# Patient Record
Sex: Male | Born: 1985 | Race: White | Hispanic: No | Marital: Married | Smoking: Never smoker
Health system: Southern US, Community
[De-identification: ages and names within clinical notes are randomized; demographics above are authoritative.]

## PROBLEM LIST (undated history)

## (undated) DIAGNOSIS — Z789 Other specified health status: Secondary | ICD-10-CM

## (undated) HISTORY — DX: Other specified health status: Z78.9

## (undated) HISTORY — PX: NO PAST SURGERIES: SHX2092

---

## 2019-09-11 DIAGNOSIS — Z20822 Contact with and (suspected) exposure to covid-19: Secondary | ICD-10-CM | POA: Diagnosis not present

## 2019-09-15 DIAGNOSIS — Z20828 Contact with and (suspected) exposure to other viral communicable diseases: Secondary | ICD-10-CM | POA: Diagnosis not present

## 2019-10-28 DIAGNOSIS — Z23 Encounter for immunization: Secondary | ICD-10-CM | POA: Diagnosis not present

## 2020-01-01 ENCOUNTER — Other Ambulatory Visit: Payer: Self-pay

## 2020-01-01 ENCOUNTER — Emergency Department (HOSPITAL_COMMUNITY)
Admission: EM | Admit: 2020-01-01 | Discharge: 2020-01-01 | Disposition: A | Discharge status: Home / Self Care | Payer: PRIVATE HEALTH INSURANCE | Attending: Emergency Medicine | Admitting: Emergency Medicine

## 2020-01-01 ENCOUNTER — Encounter (HOSPITAL_COMMUNITY): Payer: Self-pay

## 2020-01-01 DIAGNOSIS — S0993XA Unspecified injury of face, initial encounter: Secondary | ICD-10-CM | POA: Diagnosis present

## 2020-01-01 DIAGNOSIS — S0083XA Contusion of other part of head, initial encounter: Secondary | ICD-10-CM | POA: Diagnosis not present

## 2020-01-01 DIAGNOSIS — Y92230 Patient room in hospital as the place of occurrence of the external cause: Secondary | ICD-10-CM | POA: Insufficient documentation

## 2020-01-01 MED ORDER — IBUPROFEN 400 MG PO TABS
600.0000 mg | ORAL_TABLET | Freq: Once | ORAL | Status: AC
  Administered 2020-01-01: 600 mg via ORAL
  Filled 2020-01-01: qty 1

## 2020-01-01 NOTE — Discharge Instructions (Addendum)
Follow-up closely with primary doctor for reassessment and blood pressure management. Follow-up with employee health as needed. Return for new concerns. Use Tylenol every 4 hours and Motrin every 6 hours and ice as needed.
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
## 2020-01-08 ENCOUNTER — Other Ambulatory Visit: Payer: Self-pay

## 2020-01-08 ENCOUNTER — Encounter: Payer: Self-pay | Admitting: Medical-Surgical

## 2020-01-08 ENCOUNTER — Ambulatory Visit (INDEPENDENT_AMBULATORY_CARE_PROVIDER_SITE_OTHER): Payer: 59 | Admitting: Medical-Surgical

## 2020-01-08 VITALS — BP 146/79 | HR 97 | Temp 99.0°F | Ht 68.5 in | Wt 277.6 lb

## 2020-01-08 DIAGNOSIS — Z7689 Persons encountering health services in other specified circumstances: Secondary | ICD-10-CM

## 2020-01-08 DIAGNOSIS — I998 Other disorder of circulatory system: Secondary | ICD-10-CM | POA: Diagnosis not present

## 2020-01-08 DIAGNOSIS — R0602 Shortness of breath: Secondary | ICD-10-CM | POA: Diagnosis not present

## 2020-01-08 DIAGNOSIS — I479 Paroxysmal tachycardia, unspecified: Secondary | ICD-10-CM

## 2020-01-08 DIAGNOSIS — R079 Chest pain, unspecified: Secondary | ICD-10-CM | POA: Diagnosis not present

## 2020-01-08 NOTE — Progress Notes (Signed)
New Patient Office Visit  Subjective:  Patient ID: Bryan Jackson, male    DOB: Aug 12, 1985  Age: 34 y.o. MRN: [id number]  CC:  Chief Complaint  Patient presents with  . Establish Care  . Chest Pain    HPI Bryan Jackson presents to establish care.   Presents today with reports of intermittent chest pain described as intense, sharp, and pressure-like. Has been as high as 10/10 but he did not seek ED or UC care. His chest pain is often accompanied by nausea, dizziness, shortness of breath, and diaphoresis. He also notes that his pulse has been fluctuating up to the 120s without provocation. His blood pressure seems to fluctuate like this as well. Normal BP 120s/70s but has been having periods where it goes up the 150/130. These fluctuations of pulse/BP have occurred intermittently for several years but were spread far apart and lasted a very short time. The episodes have gotten more frequent and have started lasting as much as 48 hours before spontaneously resolving. Over the past week, he has had the symptoms more often than not which prompted him to come in to establish today. He admits to some anxiety and some increased irritability. 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 No recently labs or workup to evaluate symptoms. Notes that he has gained >100lbs over the past several years. Since then he has started loud snoring with daytime fatigue. Has difficulty sleeping. No previous sleep study or CPAP use.   Past Medical History:  Diagnosis Date  . No pertinent past medical history     Past Surgical History:  Procedure Laterality Date  . NO PAST SURGERIES      Family History  Problem Relation Age of Onset  . Hypertension Mother   . Hypertension Father   . Hypertension Maternal Grandmother   . Hypertension Maternal Grandfather     Social History   Socioeconomic History  . Marital status: Married    Spouse name: Not on file  . Number of children: Not on  file  . Years of education: Not on file  . Highest education level: Not on file  Occupational History  . Not on file  Tobacco Use  . Smoking status: Never Smoker  . Smokeless tobacco: Never Used  Vaping Use  . Vaping Use: Never used  Substance and Sexual Activity  . Alcohol use: Not Currently  . Drug use: Never  . Sexual activity: Yes    Birth control/protection: None  Other Topics Concern  . Not on file  Social History Narrative  . Not on file   Social Determinants of Health   Financial Resource Strain: Not on file  Food Insecurity: Not on file  Transportation Needs: Not on file  Physical Activity: Not on file  Stress: Not on file  Social Connections: Not on file  Intimate Partner Violence: Not on file    ROS Review of Systems  Constitutional: Positive for diaphoresis and fatigue. Negative for chills, fever and unexpected weight change.  Eyes: Negative for visual disturbance.  Respiratory: Positive for chest tightness and shortness of breath. Negative for cough (at night).   Cardiovascular: Positive for chest pain and palpitations. Negative for leg swelling.  Gastrointestinal: Positive for nausea. Negative for constipation, diarrhea and vomiting.  Neurological: Positive for dizziness and headaches. Negative for seizures and light-headedness.  Psychiatric/Behavioral: Positive for sleep disturbance. Negative for dysphoric mood, self-injury and suicidal ideas. The patient is nervous/anxious.     Objective:  Today's Vitals: BP (!) 146/79   Pulse 97   Temp 99 F (37.2 C)   Ht 5' 8.5" (1.74 m)   Wt 277 lb 9.6 oz (125.9 kg)   SpO2 98%   BMI 41.59 kg/m   Physical Exam  Assessment & Plan:   1. Encounter to establish care Reviewed available information and discussed health concerns with patient.   2. Fluctuating blood pressure/paroxysmal tachycardia/CP/SOB Checking labs including metanephrines/catecholamines (plasma and urine). Home sleep study for suspected  sleep apnea. CXR today. In office EKG- rate 95, normal rhythm, normal axis, no acute changes. Zio-patch ordered. Echocardiogram ordered.  - CBC - COMPLETE METABOLIC PANEL WITH GFR - TSH - Magnesium - Home sleep test - Metanephrines, plasma - Catecholamines, fractionated, plasma - EKG - Metanephrines, urine, 24 hour - Catecholamines, fractionated, urine, 24 hour - DG Chest 2 View; Future  No outpatient encounter medications on file as of 01/08/2020.   No facility-administered encounter medications on file as of 01/08/2020.    Follow-up: Return if symptoms worsen or fail to improve. Further follow up pending lab/imaging testing evaluation.   Thayer Ohm, DNP, APRN, FNP-BC [facility]

## 2020-01-10 ENCOUNTER — Encounter: Payer: Self-pay | Admitting: Medical-Surgical

## 2020-01-11 ENCOUNTER — Other Ambulatory Visit: Payer: Self-pay | Admitting: Medical-Surgical

## 2020-01-11 DIAGNOSIS — I998 Other disorder of circulatory system: Secondary | ICD-10-CM

## 2020-01-11 DIAGNOSIS — I479 Paroxysmal tachycardia, unspecified: Secondary | ICD-10-CM

## 2020-01-11 DIAGNOSIS — R0602 Shortness of breath: Secondary | ICD-10-CM

## 2020-01-11 DIAGNOSIS — R079 Chest pain, unspecified: Secondary | ICD-10-CM

## 2020-01-13 ENCOUNTER — Ambulatory Visit: Payer: 59

## 2020-01-13 ENCOUNTER — Other Ambulatory Visit: Payer: Self-pay | Admitting: Medical-Surgical

## 2020-01-13 ENCOUNTER — Other Ambulatory Visit: Payer: Self-pay | Admitting: *Deleted

## 2020-01-13 DIAGNOSIS — R079 Chest pain, unspecified: Secondary | ICD-10-CM | POA: Diagnosis not present

## 2020-01-13 DIAGNOSIS — R0602 Shortness of breath: Secondary | ICD-10-CM

## 2020-01-13 DIAGNOSIS — I479 Paroxysmal tachycardia, unspecified: Secondary | ICD-10-CM | POA: Diagnosis not present

## 2020-01-13 DIAGNOSIS — I998 Other disorder of circulatory system: Secondary | ICD-10-CM | POA: Diagnosis not present

## 2020-01-13 DIAGNOSIS — R000 Tachycardia, unspecified: Secondary | ICD-10-CM

## 2020-01-14 ENCOUNTER — Ambulatory Visit (INDEPENDENT_AMBULATORY_CARE_PROVIDER_SITE_OTHER): Payer: 59

## 2020-01-14 DIAGNOSIS — I998 Other disorder of circulatory system: Secondary | ICD-10-CM

## 2020-01-14 DIAGNOSIS — R079 Chest pain, unspecified: Secondary | ICD-10-CM

## 2020-01-14 DIAGNOSIS — I479 Paroxysmal tachycardia, unspecified: Secondary | ICD-10-CM

## 2020-01-14 DIAGNOSIS — R0602 Shortness of breath: Secondary | ICD-10-CM

## 2020-01-16 LAB — METANEPHRINES, PLASMA
Metanephrine, Free: 43 pg/mL (ref <=57)
Normetanephrine, Free: 66 pg/mL (ref <=148)
Total Metanephrines-Plasma: 109 pg/mL (ref <=205)

## 2020-01-20 LAB — COMPLETE METABOLIC PANEL WITH GFR
AG Ratio: 1.6 (calc) (ref 1.0–2.5)
ALT: 24 U/L (ref 9–46)
AST: 19 U/L (ref 10–40)
Albumin: 4.7 g/dL (ref 3.6–5.1)
Alkaline phosphatase (APISO): 88 U/L (ref 36–130)
BUN: 17 mg/dL (ref 7–25)
CO2: 23 mmol/L (ref 20–32)
Calcium: 9.4 mg/dL (ref 8.6–10.3)
Chloride: 107 mmol/L (ref 98–110)
Creat: 0.98 mg/dL (ref 0.60–1.35)
GFR, Est African American: 116 mL/min/{1.73_m2} (ref >=60)
GFR, Est Non African American: 100 mL/min/{1.73_m2} (ref >=60)
Globulin: 2.9 g/dL (calc) (ref 1.9–3.7)
Glucose, Bld: 121 mg/dL — ABNORMAL HIGH (ref 65–99)
Potassium: 4.1 mmol/L (ref 3.5–5.3)
Sodium: 140 mmol/L (ref 135–146)
Total Bilirubin: 0.3 mg/dL (ref 0.2–1.2)
Total Protein: 7.6 g/dL (ref 6.1–8.1)

## 2020-01-20 LAB — CBC: MPV: 11.2 fL (ref 7.5–12.5)

## 2020-01-20 LAB — CATECHOLAMINES, FRACTIONATED, PLASMA
Dopamine: 17 pg/mL
Epinephrine: 33 pg/mL
Norepinephrine: 399 pg/mL
Total Catecholamines: 449 pg/mL

## 2020-01-20 LAB — METANEPHRINES, URINE, 24 HOUR
METANEPHRINE: 92 mcg/24 h (ref 36–190)
METANEPHRINES, TOTAL: 292 mcg/24 h (ref 115–695)
NORMETANEPHRINE: 200 mcg/24 h (ref 35–482)
Total Volume: 1000 mL

## 2020-01-20 LAB — CATECHOLAMINES, FRACTIONATED, URINE, 24 HOUR
Calc Total (E+NE): 36 mcg/24 h (ref 26–121)
Creatinine, Urine mg/day-CATEUR: 1.67 g/(24.h) (ref 0.50–2.15)
Dopamine 24 Hr Urine: 281 mcg/24 h (ref 52–480)
Epinephrine, 24H, Ur: 3 mcg/24 h (ref 2–24)
Norepinephrine, 24H, Ur: 33 mcg/24 h (ref 15–100)
Total Volume: 1000 mL

## 2020-01-20 LAB — TSH: TSH: 0.84 mIU/L (ref 0.40–4.50)

## 2020-01-20 LAB — MAGNESIUM: Magnesium: 2.1 mg/dL (ref 1.5–2.5)

## 2020-02-03 DIAGNOSIS — R0602 Shortness of breath: Secondary | ICD-10-CM | POA: Diagnosis not present

## 2020-02-03 DIAGNOSIS — I479 Paroxysmal tachycardia, unspecified: Secondary | ICD-10-CM | POA: Diagnosis not present

## 2020-02-03 DIAGNOSIS — R079 Chest pain, unspecified: Secondary | ICD-10-CM | POA: Diagnosis not present

## 2020-02-22 ENCOUNTER — Ambulatory Visit (HOSPITAL_BASED_OUTPATIENT_CLINIC_OR_DEPARTMENT_OTHER)
Admission: RE | Admit: 2020-02-22 | Discharge: 2020-02-22 | Disposition: A | Discharge status: Home / Self Care | Payer: 59 | Source: Ambulatory Visit | Attending: Medical-Surgical | Admitting: Medical-Surgical

## 2020-02-22 ENCOUNTER — Other Ambulatory Visit: Payer: Self-pay

## 2020-02-22 DIAGNOSIS — I998 Other disorder of circulatory system: Secondary | ICD-10-CM | POA: Insufficient documentation

## 2020-02-22 DIAGNOSIS — R079 Chest pain, unspecified: Secondary | ICD-10-CM | POA: Diagnosis not present

## 2020-02-22 DIAGNOSIS — R0602 Shortness of breath: Secondary | ICD-10-CM | POA: Insufficient documentation

## 2020-02-22 DIAGNOSIS — I479 Paroxysmal tachycardia, unspecified: Secondary | ICD-10-CM | POA: Diagnosis not present

## 2020-02-22 LAB — ECHOCARDIOGRAM COMPLETE
Area-P 1/2: 4.57 cm2
S' Lateral: 2.86 cm

## 2020-02-23 ENCOUNTER — Encounter: Payer: Self-pay | Admitting: Medical-Surgical

## 2020-02-24 ENCOUNTER — Other Ambulatory Visit: Payer: Self-pay | Admitting: Medical-Surgical

## 2020-02-24 DIAGNOSIS — G4719 Other hypersomnia: Secondary | ICD-10-CM

## 2020-02-24 DIAGNOSIS — I998 Other disorder of circulatory system: Secondary | ICD-10-CM

## 2020-02-29 NOTE — Progress Notes (Signed)
Referring-Joy Larinda Buttery, NP Reason for referral-chest pain  HPI: 35 year old male for evaluation of chest pain at request of Christen Butter, NP.  Monitor January 2022 showed sinus rhythm with occasional PAC.  Echocardiogram January 2022 showed normal LV function.  Laboratories December 2021 showed TSH 0.84, K 4.1 and normal catecholamines/metanephrines.  Since December patient has had 2 episodes where his blood pressure increased significantly.  He had headaches, some chest tightness, dyspnea and palpitations associated.  Lasted several days and resolved.  If he is not having these spells he denies dyspnea, chest pain or syncope.  Cardiology now asked to evaluate.  No current outpatient medications on file.   No current facility-administered medications for this visit.    No Known Allergies   Past Medical History:  Diagnosis Date  . No pertinent past medical history     Past Surgical History:  Procedure Laterality Date  . NO PAST SURGERIES      Social History   Socioeconomic History  . Marital status: Married    Spouse name: Not on file  . Number of children: Not on file  . Years of education: Not on file  . Highest education level: Not on file  Occupational History  . Not on file  Tobacco Use  . Smoking status: Never Smoker  . Smokeless tobacco: Never Used  Vaping Use  . Vaping Use: Never used  Substance and Sexual Activity  . Alcohol use: Not Currently  . Drug use: Never  . Sexual activity: Yes    Birth control/protection: None  Other Topics Concern  . Not on file  Social History Narrative  . Not on file   Social Determinants of Health   Financial Resource Strain: Not on file  Food Insecurity: Not on file  Transportation Needs: Not on file  Physical Activity: Not on file  Stress: Not on file  Social Connections: Not on file  Intimate Partner Violence: Not on file    Family History  Problem Relation Age of Onset  . Hypertension Mother   . Hypertension  Father   . Hypertension Maternal Grandmother   . Hypertension Maternal Grandfather     ROS: no fevers or chills, productive cough, hemoptysis, dysphasia, odynophagia, melena, hematochezia, dysuria, hematuria, rash, seizure activity, orthopnea, PND, pedal edema, claudication. Remaining systems are negative.  Physical Exam:   Blood pressure 114/74, pulse 91, height 5' 8.5" (1.74 m), weight 287 lb (130.2 kg).  General:  Well developed/well nourished in NAD Skin warm/dry Patient not depressed No peripheral clubbing Back-normal HEENT-normal/normal eyelids Neck supple/normal carotid upstroke bilaterally; no bruits; no JVD; no thyromegaly chest - CTA/ normal expansion CV - RRR/normal S1 and S2; no murmurs, rubs or gallops;  PMI nondisplaced Abdomen -NT/ND, no HSM, no mass, + bowel sounds, no bruit 2+ femoral pulses, no bruits Ext-no edema, chords, 2+ DP Neuro-grossly nonfocal  ECG -normal sinus rhythm at a rate of 91, no ST changes.  Personally reviewed  A/P  1 episodes of spike in blood pressure-I checked his blood pressure in both arms today.  He was 126/94 in the right arm and 124/92 in the left arm.  I therefore doubt coarctation.  He has had screening laboratories for pheochromocytoma which were normal.  I will arrange renal Dopplers to exclude renal artery stenosis.  He will continue to follow his blood pressure and we will adjust based on follow-up readings.  I have also asked him to bring his cuff to correlate with ours.  2 chest pain-he had  chest tightness with spikes in his blood pressure but otherwise denies exertional chest pain.  His electrocardiogram is normal.  Will not pursue further ischemia evaluation at this point.  He will follow-up with [place] in 3 months.  3 episodes of dyspnea-patient has dyspnea when his blood pressure spikes but otherwise denies significant dyspnea.  Echocardiogram shows normal LV function.  Olga Millers, MD

## 2020-03-11 ENCOUNTER — Ambulatory Visit: Payer: 59 | Admitting: Cardiology

## 2020-03-11 ENCOUNTER — Encounter: Payer: Self-pay | Admitting: Cardiology

## 2020-03-11 ENCOUNTER — Other Ambulatory Visit: Payer: Self-pay

## 2020-03-11 VITALS — BP 114/74 | HR 91 | Ht 68.5 in | Wt 287.0 lb

## 2020-03-11 DIAGNOSIS — I998 Other disorder of circulatory system: Secondary | ICD-10-CM

## 2020-03-11 DIAGNOSIS — R079 Chest pain, unspecified: Secondary | ICD-10-CM

## 2020-03-11 NOTE — Patient Instructions (Signed)
  Testing/Procedures:  Your physician has requested that you have a renal artery duplex. During this test, an ultrasound is used to evaluate blood flow to the kidneys. Allow one hour for this exam. Do not eat after midnight the day before and avoid carbonated beverages. Take your medications as you usually do. [facility]     Follow-Up: At [facility], you and your health needs are our priority.  As part of our continuing mission to provide you with exceptional heart care, we have created designated Provider Care Teams.  These Care Teams include your primary Cardiologist (physician) and Advanced Practice Providers (APPs -  Physician Assistants and Nurse Practitioners) who all work together to provide you with the care you need, when you need it.  We recommend signing up for the patient portal called "MyChart".  Sign up information is provided on this After Visit Summary.  MyChart is used to connect with patients for Virtual Visits (Telemedicine).  Patients are able to view lab/test results, encounter notes, upcoming appointments, etc.  Non-urgent messages can be sent to your provider as well.   To learn more about what you can do with MyChart, go to [email or web address].    Your next appointment:   3 month(s)  The format for your next appointment:   In Person  Provider:   Olga Millers, MD

## 2020-03-16 ENCOUNTER — Encounter (HOSPITAL_COMMUNITY): Payer: 59

## 2020-03-21 ENCOUNTER — Ambulatory Visit (HOSPITAL_COMMUNITY)
Admission: RE | Admit: 2020-03-21 | Discharge: 2020-03-21 | Disposition: A | Discharge status: Home / Self Care | Payer: 59 | Source: Ambulatory Visit | Attending: Cardiovascular Disease | Admitting: Cardiovascular Disease

## 2020-03-21 ENCOUNTER — Other Ambulatory Visit: Payer: Self-pay

## 2020-03-21 DIAGNOSIS — I998 Other disorder of circulatory system: Secondary | ICD-10-CM | POA: Diagnosis not present

## 2020-03-22 ENCOUNTER — Encounter: Payer: Self-pay | Admitting: *Deleted

## 2020-04-06 ENCOUNTER — Ambulatory Visit (HOSPITAL_BASED_OUTPATIENT_CLINIC_OR_DEPARTMENT_OTHER): Payer: 59 | Attending: Medical-Surgical | Admitting: Internal Medicine

## 2020-04-06 ENCOUNTER — Other Ambulatory Visit: Payer: Self-pay

## 2020-04-06 DIAGNOSIS — I998 Other disorder of circulatory system: Secondary | ICD-10-CM

## 2020-04-06 DIAGNOSIS — G4733 Obstructive sleep apnea (adult) (pediatric): Secondary | ICD-10-CM | POA: Insufficient documentation

## 2020-04-06 DIAGNOSIS — G4719 Other hypersomnia: Secondary | ICD-10-CM | POA: Diagnosis not present

## 2020-04-09 DIAGNOSIS — I998 Other disorder of circulatory system: Secondary | ICD-10-CM

## 2020-04-09 NOTE — Procedures (Signed)
   Patient Name: Bryan Jackson, Lema Date: 04/06/2020 Gender: Male D.O.B: 1985/03/24 Age (years): 34 Referring Provider: Christen Butter NP Height (inches): 69 Interpreting Physician: Jetty Duhamel MD, ABSM Weight (lbs): 280 RPSGT: Shelah Lewandowsky BMI: 41 MRN: [id number] Neck Size: 18.00  CLINICAL INFORMATION Sleep Study Type: NPSG Indication for sleep study: Excessive Daytime Sleepiness, Fatigue, Morning Headaches, Obesity, Snoring Epworth Sleepiness Score: 19  SLEEP STUDY TECHNIQUE As per the AASM Manual for the Scoring of Sleep and Associated Events v2.3 (April 2016) with a hypopnea requiring 4% desaturations.  The channels recorded and monitored were frontal, central and occipital EEG, electrooculogram (EOG), submentalis EMG (chin), nasal and oral airflow, thoracic and abdominal wall motion, anterior tibialis EMG, snore microphone, electrocardiogram, and pulse oximetry.  MEDICATIONS Medications self-administered by patient taken the night of the study : none reported  SLEEP ARCHITECTURE The study was initiated at 10:28:10 PM and ended at 4:36:33 AM.  Sleep onset time was 4.0 minutes and the sleep efficiency was 84.6%%. The total sleep time was 311.5 minutes.  Stage REM latency was 97.0 minutes.  The patient spent 6.4%% of the night in stage N1 sleep, 77.0%% in stage N2 sleep, 0.0%% in stage N3 and 16.5% in REM.  Alpha intrusion was absent.  Supine sleep was 39.00%.  RESPIRATORY PARAMETERS The overall apnea/hypopnea index (AHI) was 5.8 per hour. There were 3 total apneas, including 3 obstructive, 0 central and 0 mixed apneas. There were 27 hypopneas and 17 RERAs.  The AHI during Stage REM sleep was 25.6 per hour.  AHI while supine was 14.8 per hour.  The mean oxygen saturation was 94.2%. The minimum SpO2 during sleep was 84.0%.  moderate snoring was noted during this study.  CARDIAC DATA The 2 lead EKG demonstrated sinus rhythm. The mean heart rate was 75.3 beats  per minute. Other EKG findings include: None.  LEG MOVEMENT DATA The total PLMS were 0 with a resulting PLMS index of 0.0. Associated arousal with leg movement index was 0.0 .  IMPRESSIONS - Very mild obstructive sleep apnea occurred during this study (AHI = 5.8/h). - Mild oxygen desaturation was noted during this study (Min O2 = 84.0%). Mean O2 saturation 94.2%. - The patient snored with moderate snoring volume. - No cardiac abnormalities were noted during this study. - Clinically significant periodic limb movements did not occur during sleep. No significant associated arousals.  DIAGNOSIS - Obstructive Sleep Apnea (G47.33)  RECOMMENDATIONS - Treatment for mild OSA is directed at symptoms. Conservative measures may include observation, weight loss and sleep position off back. Other options including CPAP, a fitted oral appliance or ENT evaluation, would be based on clinical judgment.  - Be careful with alcohol, sedatives and other CNS depressants that may worsen sleep apnea and disrupt normal sleep architecture. - Sleep hygiene should be reviewed to assess factors that may improve sleep quality. - Weight management and regular exercise should be initiated or continued if appropriate.  [Electronically signed] 04/09/2020 11:50 AM  Jetty Duhamel MD, ABSM Diplomate, American Board of Sleep Medicine   NPI: [id number]                         Jetty Duhamel Diplomate, American Board of Sleep Medicine  ELECTRONICALLY SIGNED ON:  04/09/2020, 11:43 AM [facility] PH: [phone]   FX: [phone] ACCREDITED BY THE AMERICAN ACADEMY OF SLEEP MEDICINE

## 2020-04-13 ENCOUNTER — Encounter: Payer: Self-pay | Admitting: Medical-Surgical

## 2020-04-14 ENCOUNTER — Other Ambulatory Visit: Payer: Self-pay | Admitting: Medical-Surgical

## 2020-04-14 MED ORDER — AMBULATORY NON FORMULARY MEDICATION: 0 refills | Status: DC

## 2020-05-30 NOTE — Progress Notes (Deleted)
      HPI: FU CP. Monitor January 2022 showed sinus rhythm with occasional PAC. Echocardiogram January 2022 showed normal LV function. Laboratories December 2021 showed TSH 0.84, K 4.1 and normal catecholamines/metanephrines.  Renal Dopplers February 2022 showed no renal artery stenosis.  Since last seen  Current Outpatient Medications  Medication Sig Dispense Refill  . AMBULATORY NON FORMULARY MEDICATION Continuous positive airway pressure (CPAP) machine set on AutoPAP (4-20 cmH2O), with all supplemental supplies as needed. 1 each 0   No current facility-administered medications for this visit.     Past Medical History:  Diagnosis Date  . No pertinent past medical history     Past Surgical History:  Procedure Laterality Date  . NO PAST SURGERIES      Social History   Socioeconomic History  . Marital status: Married    Spouse name: Not on file  . Number of children: 1  . Years of education: Not on file  . Highest education level: Not on file  Occupational History    Comment: [employer]  Tobacco Use  . Smoking status: Never Smoker  . Smokeless tobacco: Never Used  Vaping Use  . Vaping Use: Never used  Substance and Sexual Activity  . Alcohol use: Not Currently  . Drug use: Never  . Sexual activity: Yes    Birth control/protection: None  Other Topics Concern  . Not on file  Social History Narrative  . Not on file   Social Determinants of Health   Financial Resource Strain: Not on file  Food Insecurity: Not on file  Transportation Needs: Not on file  Physical Activity: Not on file  Stress: Not on file  Social Connections: Not on file  Intimate Partner Violence: Not on file    Family History  Problem Relation Age of Onset  . Hypertension Mother   . Hypertension Father   . Hypertension Maternal Grandmother   . Hypertension Maternal Grandfather     ROS: no fevers or chills, productive cough, hemoptysis, dysphasia, odynophagia, melena,  hematochezia, dysuria, hematuria, rash, seizure activity, orthopnea, PND, pedal edema, claudication. Remaining systems are negative.  Physical Exam: Well-developed well-nourished in no acute distress.  Skin is warm and dry.  HEENT is normal.  Neck is supple.  Chest is clear to auscultation with normal expansion.  Cardiovascular exam is regular rate and rhythm.  Abdominal exam nontender or distended. No masses palpated. Extremities show no edema. neuro grossly intact  ECG- personally reviewed  A/P  1 spikes in blood pressure-previous evaluation unremarkable including laboratories that screen for pheochromocytoma and renal Doppler showing no renal artery stenosis.  Blood pressure equal in both arms and therefore coarctation seems unlikely.  Sodium and potassium normal on previous laboratories.  2 history of chest pain-  3 history of dyspnea-  Olga Millers, MD

## 2020-06-13 ENCOUNTER — Ambulatory Visit: Payer: 59 | Admitting: Cardiology

## 2020-07-26 ENCOUNTER — Encounter: Payer: 59 | Admitting: Medical-Surgical

## 2020-07-29 ENCOUNTER — Other Ambulatory Visit: Payer: Self-pay

## 2020-07-29 ENCOUNTER — Ambulatory Visit (INDEPENDENT_AMBULATORY_CARE_PROVIDER_SITE_OTHER): Payer: 59 | Admitting: Medical-Surgical

## 2020-07-29 ENCOUNTER — Encounter: Payer: Self-pay | Admitting: Medical-Surgical

## 2020-07-29 VITALS — BP 117/89 | HR 86 | Temp 98.6°F | Ht 68.75 in | Wt 280.6 lb

## 2020-07-29 DIAGNOSIS — Z111 Encounter for screening for respiratory tuberculosis: Secondary | ICD-10-CM | POA: Diagnosis not present

## 2020-07-29 DIAGNOSIS — Z0000 Encounter for general adult medical examination without abnormal findings: Secondary | ICD-10-CM

## 2020-07-29 NOTE — Progress Notes (Signed)
HPI: Bryan Jackson is a 35 y.o. male who  has a past medical history of No pertinent past medical history.  he presents to [facility] today, 07/29/20,  for chief complaint of: Annual physical exam  Dentist: overdue, no concerns Eye exam: UTD Exercise: regular exercise doing cardio Diet: No special diets, eats all food groups COVID vaccine: Done, boosted  Concerns: None  Past medical, surgical, social and family history reviewed:  Patient Active Problem List   Diagnosis Date Noted   Fluctuating blood pressure 04/06/2020   Excessive daytime sleepiness 04/06/2020    Past Surgical History:  Procedure Laterality Date   NO PAST SURGERIES      Social History   Tobacco Use   Smoking status: Never   Smokeless tobacco: Never  Substance Use Topics   Alcohol use: Not Currently    Family History  Problem Relation Age of Onset   Hypertension Mother    Hypertension Father    Hypertension Maternal Grandmother    Hypertension Maternal Grandfather      Current medication list and allergy/intolerance information reviewed:    Current Outpatient Medications  Medication Sig Dispense Refill   AMBULATORY NON FORMULARY MEDICATION Continuous positive airway pressure (CPAP) machine set on AutoPAP (4-20 cmH2O), with all supplemental supplies as needed. (Patient not taking: Reported on 07/29/2020) 1 each 0   No current facility-administered medications for this visit.    No Known Allergies    Review of Systems: Constitutional:  No  fever, no chills, No recent illness, No unintentional weight changes. No significant fatigue.  HEENT: No  headache, no vision change, no hearing change, No sore throat, No  sinus pressure Cardiac: No  chest pain, No  pressure, No palpitations, No  Orthopnea Respiratory:  No  shortness of breath. No  Cough Gastrointestinal: No  abdominal pain, No  nausea, No  vomiting,  No  blood in stool, No  diarrhea, No   constipation  Musculoskeletal: No new myalgia/arthralgia Skin: No  Rash, No other wounds/concerning lesions Genitourinary: No  incontinence, No  abnormal genital bleeding, No abnormal genital discharge Hem/Onc: No  easy bruising/bleeding, No  abnormal lymph node Endocrine: No cold intolerance,  No heat intolerance. No polyuria/polydipsia/polyphagia  Neurologic: No  weakness, No  dizziness, No  slurred speech/focal weakness/facial droop Psychiatric: No  concerns with depression, No  concerns with anxiety, No sleep problems, No mood problems  Exam:  BP 117/89   Pulse 86   Temp 98.6 F (37 C)   Ht 5' 8.75" (1.746 m)   Wt 280 lb 9.6 oz (127.3 kg)   SpO2 96%   BMI 41.74 kg/m  Constitutional: VS see above. General Appearance: alert, well-developed, well-nourished, NAD Eyes: Normal lids and conjunctive, non-icteric sclera Ears, Nose, Mouth, Throat: MMM, Normal external inspection ears/nares/mouth/lips/gums. TM normal bilaterally.   Neck: No masses, trachea midline. No thyroid enlargement. No tenderness/mass appreciated. No lymphadenopathy Respiratory: Normal respiratory effort. no wheeze, no rhonchi, no rales Cardiovascular: S1/S2 normal, no murmur, no rub/gallop auscultated. RRR. No lower extremity edema. Pedal pulse II/IV bilaterally PT. No carotid bruit or JVD. No abdominal aortic bruit. Gastrointestinal: Nontender, no masses. No hepatomegaly, no splenomegaly. No hernia appreciated. Bowel sounds normal. Rectal exam deferred.  Musculoskeletal: Gait normal. No clubbing/cyanosis of digits.  Neurological: Normal balance/coordination. No tremor. No cranial nerve deficit on limited exam. Motor and sensation intact and symmetric. Cerebellar reflexes intact.  Skin: warm, dry, intact. No rash/ulcer. No concerning nevi or subq nodules on limited exam.  Psychiatric: Normal judgment/insight. Normal mood and affect. Oriented x3.    No results found for this or any previous visit (from the past 72  hour(s)).  No results found.   ASSESSMENT/PLAN:   1. Annual physical exam Checking CBC w/diff, CMP, and Lipid panel today. 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 Once all results are in, will be completed in full and placed at the front desk for him to pick up.  - CBC with Differential/Platelet - COMPLETE METABOLIC PANEL WITH GFR - Lipid panel  2. Screening-pulmonary TB Quantiferon Gold ordered to meet school's TB testing requirement.  - QuantiFERON-TB Gold Plus   Orders Placed This Encounter  Procedures   CBC with Differential/Platelet   COMPLETE METABOLIC PANEL WITH GFR   Lipid panel   QuantiFERON-TB Gold Plus    No orders of the defined types were placed in this encounter.   Patient Instructions  Preventive Care 87-23 Years Old, Male Preventive care refers to lifestyle choices and visits with your health care provider that can promote health and wellness. This includes: A yearly physical exam. This is also called an annual wellness visit. Regular dental and eye exams. Immunizations. Screening for certain conditions. Healthy lifestyle choices, such as: Eating a healthy diet. Getting regular exercise. Not using drugs or products that contain nicotine and tobacco. Limiting alcohol use. What can I expect for my preventive care visit? Physical exam Your health care provider may check your: Height and weight. These may be used to calculate your BMI (body mass index). BMI is a measurement that tells if you are at a healthy weight. Heart rate and blood pressure. Body temperature. Skin for abnormal spots. Counseling Your health care provider may ask you questions about your: Past medical problems. Family's medical history. Alcohol, tobacco, and drug use. Emotional well-being. Home life and relationship well-being. Sexual activity. Diet, exercise, and sleep habits. Work and work Astronomer. Access to firearms. What immunizations do I  need?  Vaccines are usually given at various ages, according to a schedule. Your health care provider will recommend vaccines for you based on your age, medicalhistory, and lifestyle or other factors, such as travel or where you work. What tests do I need? Blood tests Lipid and cholesterol levels. These may be checked every 5 years starting at age 71. Hepatitis C test. Hepatitis B test. Screening  Diabetes screening. This is done by checking your blood sugar (glucose) after you have not eaten for a while (fasting). Genital exam to check for testicular cancer or hernias. STD (sexually transmitted disease) testing, if you are at risk. Talk with your health care provider about your test results, treatment options,and if necessary, the need for more tests. Follow these instructions at home: Eating and drinking  Eat a healthy diet that includes fresh fruits and vegetables, whole grains, lean protein, and low-fat dairy products. Drink enough fluid to keep your urine pale yellow. Take vitamin and mineral supplements as recommended by your health care provider. Do not drink alcohol if your health care provider tells you not to drink. If you drink alcohol: Limit how much you have to 0-2 drinks a day. Be aware of how much alcohol is in your drink. In the [place] one drink equals one 12 oz bottle of beer (355 mL), one 5 oz glass of wine (148 mL), or one 1 oz glass of hard liquor (44 mL).  Lifestyle Take daily care of your teeth and gums. Brush your teeth every morning and night with fluoride  toothpaste. Floss one time each day. Stay active. Exercise for at least 30 minutes 5 or more days each week. Do not use any products that contain nicotine or tobacco, such as cigarettes, e-cigarettes, and chewing tobacco. If you need help quitting, ask your health care provider. Do not use drugs. If you are sexually active, practice safe sex. Use a condom or other form of protection to prevent STIs (sexually  transmitted infections). Find healthy ways to cope with stress, such as: Meditation, yoga, or listening to music. Journaling. Talking to a trusted person. Spending time with friends and family. Safety Always wear your seat belt while driving or riding in a vehicle. Do not drive: If you have been drinking alcohol. Do not ride with someone who has been drinking. When you are tired or distracted. While texting. Wear a helmet and other protective equipment during sports activities. If you have firearms in your house, make sure you follow all gun safety procedures. Seek help if you have been physically or sexually abused. What's next? Go to your health care provider once a year for an annual wellness visit. Ask your health care provider how often you should have your eyes and teeth checked. Stay up to date on all vaccines. This information is not intended to replace advice given to you by your health care provider. Make sure you discuss any questions you have with your healthcare provider. Document Revised: 10/01/2018 Document Reviewed: 01/09/2018 Elsevier Patient Education  2022 [employer].  Follow-up plan: Return in about 1 year (around 07/29/2021) for annual physical exam or sooner if needed.  Thayer Ohm, DNP, APRN, FNP-BC [facility]

## 2020-07-29 NOTE — Patient Instructions (Signed)
Preventive Care 21-35 Years Old, Male Preventive care refers to lifestyle choices and visits with your health care provider that can promote health and wellness. This includes: A yearly physical exam. This is also called an annual wellness visit. Regular dental and eye exams. Immunizations. Screening for certain conditions. Healthy lifestyle choices, such as: Eating a healthy diet. Getting regular exercise. Not using drugs or products that contain nicotine and tobacco. Limiting alcohol use. What can I expect for my preventive care visit? Physical exam Your health care provider may check your: Height and weight. These may be used to calculate your BMI (body mass index). BMI is a measurement that tells if you are at a healthy weight. Heart rate and blood pressure. Body temperature. Skin for abnormal spots. Counseling Your health care provider may ask you questions about your: Past medical problems. Family's medical history. Alcohol, tobacco, and drug use. Emotional well-being. Home life and relationship well-being. Sexual activity. Diet, exercise, and sleep habits. Work and work environment. Access to firearms. What immunizations do I need?  Vaccines are usually given at various ages, according to a schedule. Your health care provider will recommend vaccines for you based on your age, medicalhistory, and lifestyle or other factors, such as travel or where you work. What tests do I need? Blood tests Lipid and cholesterol levels. These may be checked every 5 years starting at age 20. Hepatitis C test. Hepatitis B test. Screening  Diabetes screening. This is done by checking your blood sugar (glucose) after you have not eaten for a while (fasting). Genital exam to check for testicular cancer or hernias. STD (sexually transmitted disease) testing, if you are at risk. Talk with your health care provider about your test results, treatment options,and if necessary, the need for more  tests. Follow these instructions at home: Eating and drinking  Eat a healthy diet that includes fresh fruits and vegetables, whole grains, lean protein, and low-fat dairy products. Drink enough fluid to keep your urine pale yellow. Take vitamin and mineral supplements as recommended by your health care provider. Do not drink alcohol if your health care provider tells you not to drink. If you drink alcohol: Limit how much you have to 0-2 drinks a day. Be aware of how much alcohol is in your drink. In the [place] one drink equals one 12 oz bottle of beer (355 mL), one 5 oz glass of wine (148 mL), or one 1 oz glass of hard liquor (44 mL).  Lifestyle Take daily care of your teeth and gums. Brush your teeth every morning and night with fluoride toothpaste. Floss one time each day. Stay active. Exercise for at least 30 minutes 5 or more days each week. Do not use any products that contain nicotine or tobacco, such as cigarettes, e-cigarettes, and chewing tobacco. If you need help quitting, ask your health care provider. Do not use drugs. If you are sexually active, practice safe sex. Use a condom or other form of protection to prevent STIs (sexually transmitted infections). Find healthy ways to cope with stress, such as: Meditation, yoga, or listening to music. Journaling. Talking to a trusted person. Spending time with friends and family. Safety Always wear your seat belt while driving or riding in a vehicle. Do not drive: If you have been drinking alcohol. Do not ride with someone who has been drinking. When you are tired or distracted. While texting. Wear a helmet and other protective equipment during sports activities. If you have firearms in your house, make sure   you follow all gun safety procedures. Seek help if you have been physically or sexually abused. What's next? Go to your health care provider once a year for an annual wellness visit. Ask your health care provider how often  you should have your eyes and teeth checked. Stay up to date on all vaccines. This information is not intended to replace advice given to you by your health care provider. Make sure you discuss any questions you have with your healthcare provider. Document Revised: 10/01/2018 Document Reviewed: 01/09/2018 Elsevier Patient Education  2022 [employer] Inc[employer]  

## 2020-07-31 LAB — QUANTIFERON-TB GOLD PLUS
Mitogen-NIL: 8.37 IU/mL
NIL: 0.02 IU/mL
QuantiFERON-TB Gold Plus: NEGATIVE
TB1-NIL: 0 IU/mL
TB2-NIL: 0 IU/mL

## 2020-07-31 LAB — CBC WITH DIFFERENTIAL/PLATELET
Absolute Monocytes: 484 cells/uL (ref 200–950)
Basophils Absolute: 53 cells/uL (ref 0–200)
Basophils Relative: 0.9 %
Eosinophils Absolute: 130 cells/uL (ref 15–500)
Eosinophils Relative: 2.2 %
HCT: 44.9 % (ref 38.5–50.0)
Hemoglobin: 15 g/dL (ref 13.2–17.1)
Lymphs Abs: 1257 cells/uL (ref 850–3900)
MCH: 29.4 pg (ref 27.0–33.0)
MCHC: 33.4 g/dL (ref 32.0–36.0)
MCV: 87.9 fL (ref 80.0–100.0)
MPV: 10.8 fL (ref 7.5–12.5)
Monocytes Relative: 8.2 %
Neutro Abs: 3977 cells/uL (ref 1500–7800)
Neutrophils Relative %: 67.4 %
Platelets: 279 10*3/uL (ref 140–400)
RBC: 5.11 10*6/uL (ref 4.20–5.80)
RDW: 12.5 % (ref 11.0–15.0)
Total Lymphocyte: 21.3 %
WBC: 5.9 10*3/uL (ref 3.8–10.8)

## 2020-07-31 LAB — COMPLETE METABOLIC PANEL WITH GFR
AG Ratio: 1.6 (calc) (ref 1.0–2.5)
ALT: 25 U/L (ref 9–46)
AST: 19 U/L (ref 10–40)
Albumin: 4.4 g/dL (ref 3.6–5.1)
Alkaline phosphatase (APISO): 83 U/L (ref 36–130)
BUN: 10 mg/dL (ref 7–25)
CO2: 24 mmol/L (ref 20–32)
Calcium: 9.4 mg/dL (ref 8.6–10.3)
Chloride: 107 mmol/L (ref 98–110)
Creat: 0.82 mg/dL (ref 0.60–1.35)
GFR, Est African American: 134 mL/min/{1.73_m2} (ref >=60)
GFR, Est Non African American: 115 mL/min/{1.73_m2} (ref >=60)
Globulin: 2.7 g/dL (calc) (ref 1.9–3.7)
Glucose, Bld: 92 mg/dL (ref 65–139)
Potassium: 4.5 mmol/L (ref 3.5–5.3)
Sodium: 141 mmol/L (ref 135–146)
Total Bilirubin: 0.5 mg/dL (ref 0.2–1.2)
Total Protein: 7.1 g/dL (ref 6.1–8.1)

## 2020-07-31 LAB — LIPID PANEL
Cholesterol: 160 mg/dL (ref <200)
HDL: 39 mg/dL — ABNORMAL LOW (ref >=40)
LDL Cholesterol (Calc): 103 mg/dL (calc) — ABNORMAL HIGH
Non-HDL Cholesterol (Calc): 121 mg/dL (calc) (ref <130)
Total CHOL/HDL Ratio: 4.1 (calc) (ref <5.0)
Triglycerides: 88 mg/dL (ref <150)

## 2020-08-10 ENCOUNTER — Encounter: Payer: Self-pay | Admitting: Medical-Surgical

## 2020-08-16 ENCOUNTER — Telehealth: Payer: 59 | Admitting: Emergency Medicine

## 2020-08-16 DIAGNOSIS — R0981 Nasal congestion: Secondary | ICD-10-CM | POA: Diagnosis not present

## 2020-08-16 MED ORDER — IPRATROPIUM BROMIDE 0.03 % NA SOLN: 2.0000 | Freq: Two times a day (BID) | NASAL | 0 refills | Status: DC

## 2020-08-16 MED ORDER — FLUTICASONE PROPIONATE 50 MCG/ACT NA SUSP: 2.0000 | Freq: Every day | NASAL | 0 refills | Status: DC

## 2020-08-16 NOTE — Progress Notes (Signed)
E-Visit for Sinus Problems  We are sorry that you are not feeling well.  Here is how we plan to help!  Based on what you have shared with me it looks like you have sinusitis.  Sinusitis is inflammation and infection in the sinus cavities of the head.  Based on your presentation I believe you most likely have Acute Viral Sinusitis.This is an infection most likely caused by a virus. There is not specific treatment for viral sinusitis other than to help you with the symptoms until the infection runs its course.  You may use an oral decongestant such as Mucinex D or if you have glaucoma or high blood pressure use plain Mucinex. Saline nasal spray help and can safely be used as often as needed for congestion, I have prescribed: Fluticasone nasal spray two sprays in each nostril once a day and Ipratropium Bromide nasal spray 0.03% 2 sprays in eah nostril 2-3 times a day  Some authorities believe that zinc sprays or the use of Echinacea may shorten the course of your symptoms.  Sinus infections are not as easily transmitted as other respiratory infection, however we still recommend that you avoid close contact with loved ones, especially the very young and elderly.  Remember to wash your hands thoroughly throughout the day as this is the number one way to prevent the spread of infection!  Home Care: Only take medications as instructed by your medical team. Do not take these medications with alcohol. A steam or ultrasonic humidifier can help congestion.  You can place a towel over your head and breathe in the steam from hot water coming from a faucet. Avoid close contacts especially the very young and the elderly. Cover your mouth when you cough or sneeze. Always remember to wash your hands.  Get Help Right Away If: You develop worsening fever or sinus pain. You develop a severe head ache or visual changes. Your symptoms persist after you have completed your treatment plan.  Make sure you Understand  these instructions. Will watch your condition. Will get help right away if you are not doing well or get worse.   Thank you for choosing an e-visit.  Your e-visit answers were reviewed by a board certified advanced clinical practitioner to complete your personal care plan. Depending upon the condition, your plan could have included both over the counter or prescription medications.  Please review your pharmacy choice. Make sure the pharmacy is open so you can pick up prescription now. If there is a problem, you may contact your provider through MyChart messaging and have the prescription routed to another pharmacy.  Your safety is important to us. If you have drug allergies check your prescription carefully.   For the next 24 hours you can use MyChart to ask questions about today's visit, request a non-urgent call back, or ask for a work or school excuse. You will get an email in the next two days asking about your experience. I hope that your e-visit has been valuable and will speed your recovery.  Approximately 5 minutes was used in reviewing the patient's chart, questionnaire, prescribing medications, and documentation.  

## 2020-08-22 ENCOUNTER — Telehealth (INDEPENDENT_AMBULATORY_CARE_PROVIDER_SITE_OTHER): Payer: 59 | Admitting: Medical-Surgical

## 2020-08-22 ENCOUNTER — Encounter: Payer: Self-pay | Admitting: Medical-Surgical

## 2020-08-22 DIAGNOSIS — Z5329 Procedure and treatment not carried out because of patient's decision for other reasons: Secondary | ICD-10-CM

## 2020-08-22 NOTE — Progress Notes (Signed)
Called at 1:55 no response.  Direct link for video visit sent via text and e-mail.

## 2020-08-22 NOTE — Progress Notes (Signed)
Direct link to visit sent via text by provider at 2:13pm. Phone call attempted at 2:16pm, no answer, voicemail left. Waiting in the virtual waiting room until 2:28pm, patient did not join.   Thayer Ohm, DNP, APRN, FNP-BC [facility]

## 2020-08-24 ENCOUNTER — Other Ambulatory Visit: Payer: Self-pay

## 2020-08-24 ENCOUNTER — Emergency Department (HOSPITAL_COMMUNITY): Payer: PRIVATE HEALTH INSURANCE

## 2020-08-24 ENCOUNTER — Encounter (HOSPITAL_COMMUNITY): Payer: Self-pay | Admitting: *Deleted

## 2020-08-24 ENCOUNTER — Emergency Department (HOSPITAL_COMMUNITY)
Admission: EM | Admit: 2020-08-24 | Discharge: 2020-08-24 | Disposition: A | Discharge status: Home / Self Care | Payer: PRIVATE HEALTH INSURANCE | Attending: Emergency Medicine | Admitting: Emergency Medicine

## 2020-08-24 DIAGNOSIS — H538 Other visual disturbances: Secondary | ICD-10-CM | POA: Diagnosis not present

## 2020-08-24 DIAGNOSIS — S0512XA Contusion of eyeball and orbital tissues, left eye, initial encounter: Secondary | ICD-10-CM

## 2020-08-24 DIAGNOSIS — S0012XA Contusion of left eyelid and periocular area, initial encounter: Secondary | ICD-10-CM | POA: Diagnosis not present

## 2020-08-24 DIAGNOSIS — S50812A Abrasion of left forearm, initial encounter: Secondary | ICD-10-CM | POA: Insufficient documentation

## 2020-08-24 DIAGNOSIS — Y92238 Other place in hospital as the place of occurrence of the external cause: Secondary | ICD-10-CM | POA: Insufficient documentation

## 2020-08-24 DIAGNOSIS — S40812A Abrasion of left upper arm, initial encounter: Secondary | ICD-10-CM

## 2020-08-24 DIAGNOSIS — S0592XA Unspecified injury of left eye and orbit, initial encounter: Secondary | ICD-10-CM | POA: Diagnosis present
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
## 2020-08-24 NOTE — Discharge Instructions (Addendum)
Please use ibuprofen and/or Tylenol as needed for pain Please sleep with your head of bed elevated for the next 2 nights. There is no evidence of fracture or brain injury noted on your scans. Return if you are having worsening headache or change in vision.
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
## 2020-08-26 ENCOUNTER — Encounter: Payer: Self-pay | Admitting: Medical-Surgical

## 2020-08-26 ENCOUNTER — Other Ambulatory Visit: Payer: Self-pay

## 2020-08-26 ENCOUNTER — Ambulatory Visit (INDEPENDENT_AMBULATORY_CARE_PROVIDER_SITE_OTHER): Payer: 59 | Admitting: Medical-Surgical

## 2020-08-26 VITALS — BP 137/85 | HR 93 | Resp 20 | Wt 282.0 lb

## 2020-08-26 DIAGNOSIS — H9202 Otalgia, left ear: Secondary | ICD-10-CM

## 2020-08-26 DIAGNOSIS — S0993XD Unspecified injury of face, subsequent encounter: Secondary | ICD-10-CM

## 2020-08-26 DIAGNOSIS — H6592 Unspecified nonsuppurative otitis media, left ear: Secondary | ICD-10-CM

## 2020-08-26 MED ORDER — PREDNISONE 50 MG PO TABS: 50.0000 mg | ORAL_TABLET | Freq: Every day | ORAL | 0 refills | Status: DC

## 2020-10-07 ENCOUNTER — Encounter: Payer: Self-pay | Admitting: Medical-Surgical

## 2020-10-27 ENCOUNTER — Encounter: Payer: Self-pay | Admitting: Family Medicine

## 2020-10-27 ENCOUNTER — Other Ambulatory Visit: Payer: Self-pay

## 2020-10-27 ENCOUNTER — Ambulatory Visit: Payer: 59 | Admitting: Family Medicine

## 2020-10-27 VITALS — BP 127/84 | HR 78 | Wt 280.0 lb

## 2020-10-27 DIAGNOSIS — R079 Chest pain, unspecified: Secondary | ICD-10-CM

## 2020-10-27 NOTE — Progress Notes (Signed)
Acute Office Visit  Subjective:    Patient ID: Bryan Jackson, male    DOB: [birth year], 35 y.o.   MRN: [id number]  Chief Complaint  Patient presents with   Chest Pain     Patient is in today for left chest/rib pain.   Patient reports for the past 2 and half weeks or so he has noticed some dull aching to his left chest with at least 2 episodes of sharp pain that radiates across his entire left chest and into his back at times.  He describes the discomfort as tightness, pressure.  States sometimes it makes it hard to catch his breath.  He reports the 2 episodes of sharp pain occurred while at rest, once watching TV and once while driving.  States after the episode he does feel somewhat dizzy and fatigued.  He denies any associated nausea, vomiting, sweating, vision changes.  States he has not had any recent injuries, heavy lifting, new stressors.  States he does have a cardiac history on both sides of his family.  He reports that he can trigger the pain by pressing on certain areas of his left chest or by lifting his left arm above his head.  He also states that exertion such as climbing stairs does tend to make the pressure more noticeable.  He denies any back pain, abdominal pain, urinary changes, fevers, syncope.  Past Medical History:  Diagnosis Date   No pertinent past medical history     Past Surgical History:  Procedure Laterality Date   NO PAST SURGERIES      Family History  Problem Relation Age of Onset   Hypertension Mother    Hypertension Father    Hypertension Maternal Grandmother    Hypertension Maternal Grandfather     Social History   Socioeconomic History   Marital status: Married    Spouse name: Not on file   Number of children: 1   Years of education: Not on file   Highest education level: Not on file  Occupational History    Comment: [employer]  Tobacco Use   Smoking status: Never   Smokeless tobacco: Never  Vaping Use    Vaping Use: Never used  Substance and Sexual Activity   Alcohol use: Not Currently   Drug use: Never   Sexual activity: Yes    Birth control/protection: None  Other Topics Concern   Not on file  Social History Narrative   Not on file   Social Determinants of Health   Financial Resource Strain: Not on file  Food Insecurity: Not on file  Transportation Needs: Not on file  Physical Activity: Not on file  Stress: Not on file  Social Connections: Not on file  Intimate Partner Violence: Not on file    Outpatient Medications Prior to Visit  Medication Sig Dispense Refill   cetirizine (ZYRTEC) 10 MG tablet Take 10 mg by mouth daily.     fluticasone (FLONASE) 50 MCG/ACT nasal spray Place 2 sprays into both nostrils daily. 9.9 mL 0   ipratropium (ATROVENT) 0.03 % nasal spray Place 2 sprays into both nostrils every 12 (twelve) hours. 30 mL 0   predniSONE (DELTASONE) 50 MG tablet Take 1 tablet (50 mg total) by mouth daily. 5 tablet 0   No facility-administered medications prior to visit.    No Known Allergies  Review of Symptoms All review of systems negative except what is listed in the HPI      Objective:  Physical Exam Vitals reviewed.  Constitutional:      Appearance: He is well-developed. He is obese.  HENT:     Head: Normocephalic and atraumatic.  Cardiovascular:     Heart sounds: Normal heart sounds.  Pulmonary:     Effort: Pulmonary effort is normal.     Breath sounds: Normal breath sounds.  Chest:     Chest wall: Tenderness present. No mass, crepitus or edema.    Musculoskeletal:     Right lower leg: No edema.     Left lower leg: No edema.  Skin:    General: Skin is warm and dry.     Capillary Refill: Capillary refill takes less than 2 seconds.  Neurological:     Mental Status: He is alert and oriented to person, place, and time.  Psychiatric:        Mood and Affect: Mood normal.        Behavior: Behavior normal.    BP 127/84 (BP Location: Left Arm,  Patient Position: Sitting, Cuff Size: Large)   Pulse 78   Wt 280 lb (127 kg)   BMI 41.65 kg/m  Wt Readings from Last 3 Encounters:  10/27/20 280 lb (127 kg)  08/26/20 282 lb (127.9 kg)  07/29/20 280 lb 9.6 oz (127.3 kg)    Health Maintenance Due  Topic Date Due   HIV Screening  Never done   Hepatitis C Screening  Never done    There are no preventive care reminders to display for this patient.   Lab Results  Component Value Date   TSH 0.84 01/08/2020   Lab Results  Component Value Date   WBC 5.9 07/29/2020   HGB 15.0 07/29/2020   HCT 44.9 07/29/2020   MCV 87.9 07/29/2020   PLT 279 07/29/2020   Lab Results  Component Value Date   NA 141 07/29/2020   K 4.5 07/29/2020   CO2 24 07/29/2020   GLUCOSE 92 07/29/2020   BUN 10 07/29/2020   CREATININE 0.82 07/29/2020   BILITOT 0.5 07/29/2020   AST 19 07/29/2020   ALT 25 07/29/2020   PROT 7.1 07/29/2020   CALCIUM 9.4 07/29/2020   Lab Results  Component Value Date   CHOL 160 07/29/2020   Lab Results  Component Value Date   HDL 39 (L) 07/29/2020   Lab Results  Component Value Date   LDLCALC 103 (H) 07/29/2020   Lab Results  Component Value Date   TRIG 88 07/29/2020   Lab Results  Component Value Date   CHOLHDL 4.1 07/29/2020   No results found for: HGBA1C     Assessment & Plan:   1. Chest pain of unknown etiology Given reproducibility with palpation and range of motion, likely costochondritis or other musculoskeletal cause. However, since he has had some vague, constant pressure with shortness of breath that is worse with exertion, would like to do cardiac workup. EKG similar to previous on file - NSR, sinus arrhythmia HR 78, normal axis. If labs negative treat with NSAIDs, tylenol, rest. Strict ED precautions given.   - EKG 12-Lead - Troponin I - - CK Total (and CKMB)  Follow-up pending results or as needed.   Lollie Marrow Reola Calkins, DNP, FNP-C

## 2020-10-27 NOTE — Patient Instructions (Addendum)
EKG was unremarkable Labs today If lab work is normal, let's treat like costochondritis with NSAIDs (ibuprofen or aleve) and tylenol, rest, avoiding motions that trigger pain. If symptoms persist or change please let us know.  Any feelings of impending doom, significant chest pressure/pain, pain that radiates into arm/jaw, trouble catching your breath, significant sweating, feeling like you are going to pass out, etc GO TO THE EMERGENCY DEPARTMENT.

## 2020-11-01 LAB — TROPONIN I: Troponin I: 3 ng/L (ref <=47)

## 2020-11-01 LAB — CK TOTAL AND CKMB (NOT AT ARMC): Total CK: 71 U/L (ref 44–196)

## 2020-11-08 ENCOUNTER — Encounter: Payer: Self-pay | Admitting: Medical-Surgical

## 2020-11-23 ENCOUNTER — Encounter: Payer: Self-pay | Admitting: Medical-Surgical

## 2021-01-24 ENCOUNTER — Encounter: Payer: Self-pay | Admitting: Medical-Surgical

## 2021-01-24 DIAGNOSIS — Z20822 Contact with and (suspected) exposure to covid-19: Secondary | ICD-10-CM | POA: Diagnosis not present

## 2021-01-25 ENCOUNTER — Emergency Department
Admission: RE | Admit: 2021-01-25 | Discharge: 2021-01-25 | Disposition: A | Discharge status: Home / Self Care | Payer: 59 | Source: Ambulatory Visit | Attending: Emergency Medicine | Admitting: Emergency Medicine

## 2021-01-25 ENCOUNTER — Other Ambulatory Visit: Payer: Self-pay

## 2021-01-25 VITALS — BP 144/88 | HR 97 | Temp 98.3°F | Resp 20 | Ht 69.0 in | Wt 280.0 lb

## 2021-01-25 DIAGNOSIS — J101 Influenza due to other identified influenza virus with other respiratory manifestations: Secondary | ICD-10-CM | POA: Diagnosis not present

## 2021-01-25 LAB — POCT INFLUENZA A/B
Influenza A, POC: POSITIVE — AB
Influenza B, POC: NEGATIVE

## 2021-01-25 MED ORDER — IBUPROFEN 600 MG PO TABS: 600.0000 mg | ORAL_TABLET | Freq: Four times a day (QID) | ORAL | 0 refills | Status: DC | PRN

## 2021-01-25 MED ORDER — AEROCHAMBER PLUS MISC: 2 refills | Status: DC

## 2021-01-25 MED ORDER — OSELTAMIVIR PHOSPHATE 75 MG PO CAPS: 75.0000 mg | ORAL_CAPSULE | Freq: Two times a day (BID) | ORAL | 0 refills | Status: DC

## 2021-01-25 MED ORDER — HYDROCOD POLST-CPM POLST ER 10-8 MG/5ML PO SUER: 5.0000 mL | Freq: Two times a day (BID) | ORAL | 0 refills | Status: DC | PRN

## 2021-01-25 MED ORDER — FLUTICASONE PROPIONATE 50 MCG/ACT NA SUSP: 2.0000 | Freq: Every day | NASAL | 0 refills | Status: DC

## 2021-01-25 MED ORDER — ALBUTEROL SULFATE HFA 108 (90 BASE) MCG/ACT IN AERS: 1.0000 | INHALATION_SPRAY | RESPIRATORY_TRACT | 0 refills | Status: DC | PRN

## 2021-01-25 NOTE — ED Provider Notes (Signed)
HPI  SUBJECTIVE:  Bryan Jackson is a 35 y.o. male who presents with 2 days of body aches, headaches, nasal congestion, rhinorrhea, postnasal drip, sore throat, nonproductive cough, shortness of breath.  Some diarrhea and abdominal pain.  States that he gets easily fatigued.  No fevers, wheezing, nausea, vomiting.  He has several family members with flu at home.  He had a negative COVID test at [facility] yesterday.  He has gotten 3 doses of the COVID-vaccine and this years flu vaccine.  Took Tylenol within 6 hours of evaluation.  He states Tylenol is helping.  He is also sleeping upright and has tried Robitussin.  His symptoms are worse at night.  States that he is unable to sleep at night secondary to the cough.  He has no past medical history.  QMV:HQIONG, Joy, NP   Past Medical History:  Diagnosis Date   No pertinent past medical history     Past Surgical History:  Procedure Laterality Date   NO PAST SURGERIES      Family History  Problem Relation Age of Onset   Hypertension Mother    Hypertension Father    Hypertension Maternal Grandmother    Hypertension Maternal Grandfather     Social History   Tobacco Use   Smoking status: Never   Smokeless tobacco: Never  Vaping Use   Vaping Use: Never used  Substance Use Topics   Alcohol use: Yes    Comment: rarely   Drug use: Never    No current facility-administered medications for this encounter.  Current Outpatient Medications:    albuterol (VENTOLIN HFA) 108 (90 Base) MCG/ACT inhaler, Inhale 1-2 puffs into the lungs every 4 (four) hours as needed for wheezing or shortness of breath., Disp: 1 each, Rfl: 0   chlorpheniramine-HYDROcodone (TUSSIONEX PENNKINETIC ER) 10-8 MG/5ML SUER, Take 5 mLs by mouth every 12 (twelve) hours as needed for cough., Disp: 60 mL, Rfl: 0   fluticasone (FLONASE) 50 MCG/ACT nasal spray, Place 2 sprays into both nostrils daily., Disp: 16 g, Rfl: 0   ibuprofen (ADVIL) 600 MG tablet, Take 1 tablet  (600 mg total) by mouth every 6 (six) hours as needed., Disp: 30 tablet, Rfl: 0   oseltamivir (TAMIFLU) 75 MG capsule, Take 1 capsule (75 mg total) by mouth 2 (two) times daily. X 5 days, Disp: 10 capsule, Rfl: 0   Spacer/Aero-Holding Chambers (AEROCHAMBER PLUS) inhaler, Use with inhaler, Disp: 1 each, Rfl: 2   ipratropium (ATROVENT) 0.03 % nasal spray, Place 2 sprays into both nostrils every 12 (twelve) hours., Disp: 30 mL, Rfl: 0  No Known Allergies   ROS  As noted in HPI.   Physical Exam  BP (!) 144/88    Pulse 97    Temp 98.3 F (36.8 C) (Oral)    Resp 20    Ht 5\' 9"  (1.753 m)    Wt 127 kg    SpO2 100%    BMI 41.35 kg/m   Constitutional: Well developed, well nourished, no acute distress Eyes: PERRL, EOMI, conjunctiva normal bilaterally HENT: Normocephalic, atraumatic,mucus membranes moist.  Positive nasal congestion. Respiratory: Clear to auscultation bilaterally, no rales, no wheezing, no rhonchi Cardiovascular: Normal rate and rhythm, no murmurs, no gallops, no rubs GI: Nondistended skin: No rash, skin intact Musculoskeletal: no deformities Neurologic: Alert & oriented x 3, CN III-XII grossly intact, no motor deficits, sensation grossly intact Psychiatric: Speech and behavior appropriate   ED Course   Medications - No data to display  Orders Placed This  Encounter  Procedures   POCT Influenza A/B    Standing Status:   Standing    Number of Occurrences:   1   Results for orders placed or performed during the hospital encounter of 01/25/21 (from the past 24 hour(s))  POCT Influenza A/B     Status: Abnormal   Collection Time: 01/25/21  4:41 PM  Result Value Ref Range   Influenza A, POC Positive (A) Negative   Influenza B, POC Negative Negative   No results found.  ED Clinical Impression  1. Influenza A      ED Assessment/Plan  [facility] narcotic database reviewed.  No opiate prescriptions in 2 years.  Influenza A positive.  Home with Tamiflu, Flonase,  saline nasal irrigation, albuterol inhaler with a spacer, Tussionex, Tylenol/ibuprofen.  Work note to return on Sunday  Discussed labs, MDM, treatment plan, and plan for follow-up with patient patient agrees with plan.   Meds ordered this encounter  Medications   fluticasone (FLONASE) 50 MCG/ACT nasal spray    Sig: Place 2 sprays into both nostrils daily.    Dispense:  16 g    Refill:  0   chlorpheniramine-HYDROcodone (TUSSIONEX PENNKINETIC ER) 10-8 MG/5ML SUER    Sig: Take 5 mLs by mouth every 12 (twelve) hours as needed for cough.    Dispense:  60 mL    Refill:  0   ibuprofen (ADVIL) 600 MG tablet    Sig: Take 1 tablet (600 mg total) by mouth every 6 (six) hours as needed.    Dispense:  30 tablet    Refill:  0   oseltamivir (TAMIFLU) 75 MG capsule    Sig: Take 1 capsule (75 mg total) by mouth 2 (two) times daily. X 5 days    Dispense:  10 capsule    Refill:  0   albuterol (VENTOLIN HFA) 108 (90 Base) MCG/ACT inhaler    Sig: Inhale 1-2 puffs into the lungs every 4 (four) hours as needed for wheezing or shortness of breath.    Dispense:  1 each    Refill:  0   Spacer/Aero-Holding Chambers (AEROCHAMBER PLUS) inhaler    Sig: Use with inhaler    Dispense:  1 each    Refill:  2    Please educate patient on use      *This clinic note was created using Dragon dictation software. Therefore, there may be occasional mistakes despite careful proofreading. ?    Domenick Gong, MD 01/27/21 [phone]

## 2021-01-25 NOTE — Discharge Instructions (Addendum)
Finish the Tamiflu, even if you feel better.  Flonase, saline nasal irrigation with a [facility] Med rinse and distilled water as often as you want, Mucinex or Mucinex D.  2 puffs from your albuterol inhaler with a spacer every 4-6 hours as needed for coughing, wheezing, shortness of breath.  Tussionex for the cough.  Take 600 mg ibuprofen, 1000 mg of Tylenol 3-4 times a day as needed.

## 2021-01-25 NOTE — ED Triage Notes (Signed)
Pt presents to Urgent Care with c/o cough and generalized body aches x 2 days w/ fever at onset of symptoms. Pt reports negative COVID test from [facility] yesterday.

## 2021-01-26 ENCOUNTER — Telehealth (INDEPENDENT_AMBULATORY_CARE_PROVIDER_SITE_OTHER): Payer: Self-pay | Admitting: Family Medicine

## 2021-01-26 DIAGNOSIS — Z91199 Patient's noncompliance with other medical treatment and regimen due to unspecified reason: Secondary | ICD-10-CM

## 2021-01-26 NOTE — Progress Notes (Signed)
[id number] Appointment: Direct link to virtual visit sent at [phone] and phone call made at [phone] with no answer.   No show for appointment.

## 2021-03-10 ENCOUNTER — Other Ambulatory Visit: Payer: Self-pay

## 2021-03-10 ENCOUNTER — Emergency Department
Admission: RE | Admit: 2021-03-10 | Discharge: 2021-03-10 | Disposition: A | Discharge status: Home / Self Care | Payer: 59 | Source: Ambulatory Visit

## 2021-03-10 VITALS — BP 138/81 | HR 92 | Temp 98.3°F | Resp 18 | Ht 70.0 in | Wt 274.0 lb

## 2021-03-10 DIAGNOSIS — R519 Headache, unspecified: Secondary | ICD-10-CM

## 2021-03-10 DIAGNOSIS — R110 Nausea: Secondary | ICD-10-CM

## 2021-03-10 MED ORDER — ONDANSETRON 8 MG PO TBDP: 8.0000 mg | ORAL_TABLET | Freq: Three times a day (TID) | ORAL | 0 refills | Status: DC | PRN
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
## 2021-03-10 NOTE — Discharge Instructions (Addendum)
Advised patient may use OTC Ibuprofen, Tylenol or extra strength Excedrin for headache daily or as needed.  Advised/instructed patient if headache and/or dizziness worsens please go to nearest ED for further evaluation to include possible imaging of head.  Advised may take Zofran daily or as needed for nausea.
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
## 2021-03-26 ENCOUNTER — Emergency Department (HOSPITAL_COMMUNITY)
Admission: EM | Admit: 2021-03-26 | Discharge: 2021-03-26 | Disposition: A | Discharge status: Home / Self Care | Payer: PRIVATE HEALTH INSURANCE | Attending: Emergency Medicine | Admitting: Emergency Medicine

## 2021-03-26 ENCOUNTER — Emergency Department (HOSPITAL_COMMUNITY): Payer: PRIVATE HEALTH INSURANCE

## 2021-03-26 ENCOUNTER — Encounter (HOSPITAL_COMMUNITY): Payer: Self-pay | Admitting: Emergency Medicine

## 2021-03-26 ENCOUNTER — Other Ambulatory Visit: Payer: Self-pay

## 2021-03-26 DIAGNOSIS — R079 Chest pain, unspecified: Secondary | ICD-10-CM | POA: Diagnosis not present

## 2021-03-26 DIAGNOSIS — Y990 Civilian activity done for income or pay: Secondary | ICD-10-CM | POA: Insufficient documentation

## 2021-03-26 DIAGNOSIS — S299XXA Unspecified injury of thorax, initial encounter: Secondary | ICD-10-CM | POA: Diagnosis not present

## 2021-03-26 DIAGNOSIS — R0789 Other chest pain: Secondary | ICD-10-CM | POA: Diagnosis not present

## 2021-03-26 MED ORDER — ONDANSETRON 4 MG PO TBDP
4.0000 mg | ORAL_TABLET | Freq: Once | ORAL | Status: AC
Start: 2021-03-26 — End: 2021-03-26
  Administered 2021-03-26: 4 mg via ORAL
  Filled 2021-03-26: qty 1

## 2021-03-26 MED ORDER — ONDANSETRON 8 MG PO TBDP: 8.0000 mg | ORAL_TABLET | Freq: Three times a day (TID) | ORAL | 0 refills | Status: DC | PRN
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
## 2021-03-26 NOTE — Discharge Instructions (Signed)
Your xray is normal. Return with any difficulty breathing, dizziness or loss of consciousness.   Zofran is at the pharmacy.

## 2021-04-05 ENCOUNTER — Ambulatory Visit: Payer: Self-pay

## 2021-04-05 ENCOUNTER — Other Ambulatory Visit: Payer: Self-pay

## 2021-04-05 ENCOUNTER — Other Ambulatory Visit: Payer: Self-pay | Admitting: Family Medicine

## 2021-04-05 ENCOUNTER — Ambulatory Visit: Payer: 59 | Admitting: Physician Assistant

## 2021-04-05 DIAGNOSIS — S20212A Contusion of left front wall of thorax, initial encounter: Secondary | ICD-10-CM

## 2021-04-10 NOTE — Progress Notes (Unsigned)
HPI with pertinent ROS:   CC:   HPI:   I reviewed the past medical history, family history, social history, surgical history, and allergies today and no changes were needed.  Please see the problem list section below in epic for further details.   Physical exam:   General: Well Developed, well nourished, and in no acute distress.  Neuro: Alert and oriented x3, extra-ocular muscles intact, sensation grossly intact.  HEENT: Normocephalic, atraumatic, pupils equal round reactive to light, neck supple, no masses, no lymphadenopathy, thyroid nonpalpable.  Skin: Warm and dry, no rashes. Cardiac: Regular rate and rhythm, no murmurs rubs or gallops, no lower extremity edema.  Respiratory: Clear to auscultation bilaterally. Not using accessory muscles, speaking in full sentences.  Impression and Recommendations:    There are no diagnoses linked to this encounter.  No follow-ups on file. ___________________________________________ Clearnce Sorrel, DNP, APRN, FNP-BC Primary [facility] and [place]

## 2021-04-11 ENCOUNTER — Ambulatory Visit: Payer: 59 | Admitting: Medical-Surgical

## 2021-04-11 ENCOUNTER — Encounter: Payer: Self-pay | Admitting: Medical-Surgical

## 2021-04-11 ENCOUNTER — Other Ambulatory Visit: Payer: Self-pay

## 2021-04-11 VITALS — BP 131/85 | HR 93 | Resp 18 | Ht 70.0 in | Wt 275.0 lb

## 2021-04-11 DIAGNOSIS — G4452 New daily persistent headache (NDPH): Secondary | ICD-10-CM | POA: Diagnosis not present

## 2021-04-11 DIAGNOSIS — Z09 Encounter for follow-up examination after completed treatment for conditions other than malignant neoplasm: Secondary | ICD-10-CM | POA: Diagnosis not present
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
## 2021-04-26 ENCOUNTER — Ambulatory Visit (HOSPITAL_BASED_OUTPATIENT_CLINIC_OR_DEPARTMENT_OTHER): Payer: 59 | Admitting: General Practice

## 2021-04-27 ENCOUNTER — Encounter: Payer: Self-pay | Admitting: Student

## 2021-04-27 ENCOUNTER — Ambulatory Visit: Payer: 59 | Admitting: Student

## 2021-04-27 VITALS — BP 132/74 | HR 99 | Ht 70.0 in | Wt 279.2 lb

## 2021-04-27 DIAGNOSIS — S299XXD Unspecified injury of thorax, subsequent encounter: Secondary | ICD-10-CM

## 2021-04-27 DIAGNOSIS — Z026 Encounter for examination for insurance purposes: Secondary | ICD-10-CM

## 2021-04-27 DIAGNOSIS — R002 Palpitations: Secondary | ICD-10-CM | POA: Diagnosis not present

## 2021-04-27 DIAGNOSIS — G4733 Obstructive sleep apnea (adult) (pediatric): Secondary | ICD-10-CM

## 2021-04-27 DIAGNOSIS — I998 Other disorder of circulatory system: Secondary | ICD-10-CM

## 2021-04-27 NOTE — Patient Instructions (Signed)
Medication Instructions:  ?No changes ?*If you need a refill on your cardiac medications before your next appointment, please call your pharmacy* ? ? ?Lab Work: ?BMET, Magnesium, TSH. ?If you have labs (blood work) drawn today and your tests are completely normal, you will receive your results only by: ?MyChart Message (if you have MyChart) OR ?A paper copy in the mail ?If you have any lab test that is abnormal or we need to change your treatment, we will call you to review the results. ? ? ?Testing/Procedures: ?No Testing ? ? ?Follow-Up: ?At [facility], you and your health needs are our priority.  As part of our continuing mission to provide you with exceptional heart care, we have created designated Provider Care Teams.  These Care Teams include your primary Cardiologist (physician) and Advanced Practice Providers (APPs -  Physician Assistants and Nurse Practitioners) who all work together to provide you with the care you need, when you need it. ? ?We recommend signing up for the patient portal called "MyChart".  Sign up information is provided on this After Visit Summary.  MyChart is used to connect with patients for Virtual Visits (Telemedicine).  Patients are able to view lab/test results, encounter notes, upcoming appointments, etc.  Non-urgent messages can be sent to your provider as well.   ?To learn more about what you can do with MyChart, go to [email or web address].   ? ?Your next appointment:   ?1 year(s) ? ?The format for your next appointment:   ?In Person ? ?Provider:   ?Olga Millers, MD   ? ? ?  ?

## 2021-04-28 LAB — BASIC METABOLIC PANEL
BUN/Creatinine Ratio: 12 (ref 9–20)
BUN: 11 mg/dL (ref 6–20)
CO2: 21 mmol/L (ref 20–29)
Calcium: 9.1 mg/dL (ref 8.7–10.2)
Chloride: 103 mmol/L (ref 96–106)
Creatinine, Ser: 0.9 mg/dL (ref 0.76–1.27)
Glucose: 85 mg/dL (ref 70–99)
Potassium: 4.2 mmol/L (ref 3.5–5.2)
Sodium: 139 mmol/L (ref 134–144)
eGFR: 114 mL/min/{1.73_m2} (ref >59)

## 2021-04-28 LAB — TSH: TSH: 0.65 u[IU]/mL (ref 0.450–4.500)

## 2021-04-28 LAB — MAGNESIUM: Magnesium: 2.3 mg/dL (ref 1.6–2.3)

## 2021-05-09 ENCOUNTER — Ambulatory Visit: Payer: 59 | Admitting: Psychiatry

## 2021-06-13 ENCOUNTER — Ambulatory Visit: Payer: 59 | Admitting: Psychiatry

## 2021-06-21 ENCOUNTER — Telehealth: Payer: 59 | Admitting: Physician Assistant

## 2021-06-21 DIAGNOSIS — J019 Acute sinusitis, unspecified: Secondary | ICD-10-CM

## 2021-06-21 DIAGNOSIS — B9689 Other specified bacterial agents as the cause of diseases classified elsewhere: Secondary | ICD-10-CM | POA: Diagnosis not present

## 2021-06-21 MED ORDER — DOXYCYCLINE HYCLATE 100 MG PO TABS: 100.0000 mg | ORAL_TABLET | Freq: Two times a day (BID) | ORAL | 0 refills | Status: DC

## 2021-06-21 NOTE — Progress Notes (Signed)

## 2021-06-21 NOTE — Progress Notes (Signed)
I have spent 5 minutes in review of e-visit questionnaire, review and updating patient chart, medical decision making and response to patient.   Ariauna Farabee Cody Sylena Lotter, PA-C    

## 2021-06-22 ENCOUNTER — Encounter: Payer: Self-pay | Admitting: Medical-Surgical

## 2021-06-26 ENCOUNTER — Telehealth: Payer: 59 | Admitting: Physician Assistant

## 2021-06-26 DIAGNOSIS — J329 Chronic sinusitis, unspecified: Secondary | ICD-10-CM

## 2021-06-27 ENCOUNTER — Ambulatory Visit: Payer: 59

## 2021-06-27 NOTE — Progress Notes (Signed)
Based on what you shared with me, I feel your condition warrants further evaluation and I recommend that you be seen in a face to face visit. Giving ongoing symptoms despite recent treatment and intermittent blurring of vision of L eye, you will require an in-person evaluation for examination and to make sure proper ongoing management is given.    NOTE: There will be NO CHARGE for this eVisit   If you are having a true medical emergency please call 911.      For an urgent face to face visit,  has six urgent care centers for your convenience:     [facility] at [facility] Directions [phone] [address] [place], [place] [address]    [facility]) Get Driving Directions [phone] [address] [place], [place] [address]  [facility]) Get Driving Directions [phone] [address] [place],  [place]  [address]  [facility] at [facility] Get Driving Directions [phone] [address], [address] [place], [place] [address]   [facility] at [facility] Get Driving Directions  [phone] [address] [place], [place] [address]   [facility] at [facility] Directions [phone] [address] [place], [place] [address]  Your MyChart E-visit questionnaire answers were reviewed by a board certified advanced clinical practitioner to complete your personal care plan based on your specific symptoms.  Thank you for using e-Visits.

## 2021-06-29 ENCOUNTER — Encounter: Payer: Self-pay | Admitting: Family Medicine

## 2021-06-29 ENCOUNTER — Ambulatory Visit: Payer: 59 | Admitting: Family Medicine

## 2021-06-29 VITALS — BP 123/69 | HR 94 | Ht 70.0 in | Wt 282.0 lb

## 2021-06-29 DIAGNOSIS — H9202 Otalgia, left ear: Secondary | ICD-10-CM | POA: Diagnosis not present

## 2021-06-29 DIAGNOSIS — J019 Acute sinusitis, unspecified: Secondary | ICD-10-CM | POA: Diagnosis not present

## 2021-06-29 MED ORDER — AZITHROMYCIN 250 MG PO TABS: ORAL_TABLET | ORAL | 0 refills | Status: AC

## 2021-06-29 MED ORDER — PREDNISONE 20 MG PO TABS: 40.0000 mg | ORAL_TABLET | Freq: Every day | ORAL | 0 refills | Status: DC

## 2021-06-29 NOTE — Progress Notes (Signed)
Pt reports that his L ear has been bothering him x 14 days. He had been taking Sudafed but stopped this on Monday. He continues to have sinus pressure.   He did an E-visit and was started on Doxycycline on 06/21/2021. He said that he has about 4 days left.

## 2021-06-29 NOTE — Progress Notes (Signed)
Acute Office Visit  Subjective:     Patient ID: Bryan Jackson, male    DOB: 1985-03-05, 36 y.o.   MRN: [id number]  No chief complaint on file.   HPI Patient is in today for sinus congestion and left ear pain.  He says his symptoms really started about 2 weeks ago he thought it was just his sinuses he had a lot of congestion headache pressure and some ear pressure but then it was getting worse.  He ended up doing an E-visit on May 24.  He was given doxycycline which she is currently taking he still has about 2 more days left.  He does feel just a little bit better but he still having a lot of pain in that left ear he does feel like he can hear out of but a little bit better.  He still has a lot of pressure over his forehead and sinuses.  He has been taking Sudafed as well.  But stopped that after he felt like it was not helping much and has been using fluticasone.  ROS      Objective:    BP 123/69   Pulse 94   Ht 5\' 10"  (1.778 m)   Wt 282 lb (127.9 kg)   SpO2 98%   BMI 40.46 kg/m    Physical Exam Constitutional:      Appearance: He is well-developed.  HENT:     Head: Normocephalic and atraumatic.     Right Ear: Tympanic membrane, ear canal and external ear normal.     Left Ear: Tympanic membrane, ear canal and external ear normal.     Nose: Nose normal.     Mouth/Throat:     Mouth: Mucous membranes are moist.  Eyes:     Conjunctiva/sclera: Conjunctivae normal.     Pupils: Pupils are equal, round, and reactive to light.  Neck:     Thyroid: No thyromegaly.  Cardiovascular:     Rate and Rhythm: Normal rate.     Heart sounds: Normal heart sounds.  Pulmonary:     Effort: Pulmonary effort is normal.     Breath sounds: Normal breath sounds.  Musculoskeletal:     Cervical back: Neck supple.  Lymphadenopathy:     Cervical: No cervical adenopathy.  Skin:    General: Skin is warm and dry.  Neurological:     Mental Status: He is alert and oriented to person, place,  and time.    No results found for any visits on 06/29/21.      Assessment & Plan:   Problem List Items Addressed This Visit   None Visit Diagnoses     Acute non-recurrent sinusitis, unspecified location    -  Primary   Relevant Medications   azithromycin (ZITHROMAX) 250 MG tablet   predniSONE (DELTASONE) 20 MG tablet   Left ear pain           Acute sinusitis-he is almost completed a course of Doxy.  We discussed maybe covering with azithromycin.  His left ear actually looks great on exam today.  No worrisome findings suspect it may just be some eustachian tube dysfunction or pressure from the sinus cavities.  So we discussed adding a round of prednisone.  Make sure to take with food and water.  Meds ordered this encounter  Medications   azithromycin (ZITHROMAX) 250 MG tablet    Sig: 2 Ttabs PO on Day 1, then one a day x 4 days.    Dispense:  6  tablet    Refill:  0   predniSONE (DELTASONE) 20 MG tablet    Sig: Take 2 tablets (40 mg total) by mouth daily with breakfast.    Dispense:  10 tablet    Refill:  0    No follow-ups on file.  Beatrice Lecher, MD

## 2021-07-02 ENCOUNTER — Other Ambulatory Visit: Payer: Self-pay | Admitting: Family Medicine

## 2021-07-04 ENCOUNTER — Encounter: Payer: Self-pay | Admitting: Family Medicine

## 2021-07-04 MED ORDER — AMOXICILLIN-POT CLAVULANATE 875-125 MG PO TABS: 1.0000 | ORAL_TABLET | Freq: Two times a day (BID) | ORAL | 0 refills | Status: DC

## 2021-07-04 MED ORDER — PREDNISONE 20 MG PO TABS: ORAL_TABLET | ORAL | 0 refills | Status: AC

## 2021-07-04 NOTE — Telephone Encounter (Signed)
Meds ordered this encounter  Medications   amoxicillin-clavulanate (AUGMENTIN) 875-125 MG tablet    Sig: Take 1 tablet by mouth 2 (two) times daily.    Dispense:  14 tablet    Refill:  0   predniSONE (DELTASONE) 20 MG tablet    Sig: Take 2 tablets (40 mg total) by mouth daily with breakfast for 4 days, THEN 1 tablet (20 mg total) daily with breakfast for 4 days.    Dispense:  12 tablet    Refill:  0

## 2021-10-09 ENCOUNTER — Other Ambulatory Visit: Payer: Self-pay | Admitting: Family Medicine

## 2021-10-10 ENCOUNTER — Encounter: Payer: Self-pay | Admitting: Family Medicine

## 2021-10-16 ENCOUNTER — Telehealth: Payer: 59 | Admitting: Physician Assistant

## 2021-10-16 ENCOUNTER — Other Ambulatory Visit (HOSPITAL_COMMUNITY): Payer: Self-pay

## 2021-10-16 DIAGNOSIS — J019 Acute sinusitis, unspecified: Secondary | ICD-10-CM | POA: Diagnosis not present

## 2021-10-16 DIAGNOSIS — B9689 Other specified bacterial agents as the cause of diseases classified elsewhere: Secondary | ICD-10-CM | POA: Diagnosis not present

## 2021-10-16 MED ORDER — AMOXICILLIN-POT CLAVULANATE 875-125 MG PO TABS
1.0000 | ORAL_TABLET | Freq: Two times a day (BID) | ORAL | 0 refills | Status: DC
  Filled 2021-10-16: qty 14, 7d supply, fill #0

## 2021-10-16 NOTE — Progress Notes (Signed)
I have spent 5 minutes in review of e-visit questionnaire, review and updating patient chart, medical decision making and response to patient.   Dona Walby Cody Drakkar Medeiros, PA-C    

## 2021-10-16 NOTE — Progress Notes (Signed)

## 2021-10-17 ENCOUNTER — Other Ambulatory Visit (HOSPITAL_COMMUNITY): Payer: Self-pay

## 2021-11-17 ENCOUNTER — Telehealth: Payer: 59 | Admitting: Emergency Medicine

## 2021-11-17 DIAGNOSIS — L03011 Cellulitis of right finger: Secondary | ICD-10-CM

## 2021-11-17 MED ORDER — CEPHALEXIN 500 MG PO CAPS: 500.0000 mg | ORAL_CAPSULE | Freq: Four times a day (QID) | ORAL | 0 refills | Status: AC

## 2021-11-17 NOTE — Progress Notes (Signed)
E Visit for Cellulitis  We are sorry that you are not feeling well. Here is how we plan to help!  Based on what you shared with me it looks like you have an infection of your finger around the nail called a paronychia.  This kind of skin infection looks like areas of skin redness, swelling, and warmth of the finger around the nail; it develops as a result of bacteria entering under the skin.   I have prescribed:  Keflex 500mg  take one by mouth four times a day for 5 days  I have sent your prescription to [facility] at [address] in [place]  Also please soak your finger in warm water at least twice a day.   If this treatment plan doesn't work and your finger is not getting better or it is getting worse, please be seen in person such as at an Urgent Care. If there is a pocket of pus from the infection, you may need to have it drained.   HOME CARE:  Take your medications as ordered and take all of them, even if the skin irritation appears to be healing.   GET HELP RIGHT AWAY IF:  Symptoms that don't begin to go away within 48 hours. Severe redness persists or worsens If the area turns color, spreads or swells. If it blisters and opens, develops yellow-brown crust or bleeds. You develop a fever or chills. If the pain increases or becomes unbearable.  Are unable to keep fluids and food down.  MAKE SURE YOU   Understand these instructions. Will watch your condition. Will get help right away if you are not doing well or get worse.  Thank you for choosing an e-visit.  Your e-visit answers were reviewed by a board certified advanced clinical practitioner to complete your personal care plan. Depending upon the condition, your plan could have included both over the counter or prescription medications.  Please review your pharmacy choice. Make sure the pharmacy is open so you can pick up prescription now. If there is a problem, you may contact your provider through [employer] and  have the prescription routed to another pharmacy.  Your safety is important to [place]. If you have drug allergies check your prescription carefully.   For the next 24 hours you can use MyChart to ask questions about today's visit, request a non-urgent call back, or ask for a work or school excuse. You will get an email in the next two days asking about your experience. I hope that your e-visit has been valuable and will speed your recovery.  I have spent 5 minutes in review of e-visit questionnaire, review and updating patient chart, medical decision making and response to patient.   Willeen Cass, PhD, FNP-BC

## 2021-12-24 ENCOUNTER — Telehealth: Payer: 59 | Admitting: Nurse Practitioner

## 2021-12-24 DIAGNOSIS — J329 Chronic sinusitis, unspecified: Secondary | ICD-10-CM

## 2021-12-24 MED ORDER — AZELASTINE-FLUTICASONE 137-50 MCG/ACT NA SUSP: 1.0000 | Freq: Two times a day (BID) | NASAL | 0 refills | Status: DC

## 2021-12-24 NOTE — Progress Notes (Signed)
.  Hello Mr. Bernstein. It appears this is your 3rd sinus infection in 6 months. At this time we recommend you be seen for a face to face visit with your primary care. It is highly unusual to have this many sinus infections in this small amount of time. I have sent a nose spray to help with your current symptoms of sinus pressure and ear pain.      Please contact your primary care physician practice to be seen. Many offices offer virtual options to be seen via video if you are not comfortable going in person to a medical facility at this time.  NOTE: You will NOT be charged for this eVisit.  If you do not have a PCP, [facility] offers a free physician referral service available at [phone]. Our trained staff has the experience, knowledge and resources to put you in touch with a physician who is right for you.    If you are having a true medical emergency please call 911.   Your e-visit answers were reviewed by a board certified advanced clinical practitioner to complete your personal care plan.  Thank you for using e-Visits.

## 2021-12-24 NOTE — Progress Notes (Signed)
I have spent 5 minutes in review of e-visit questionnaire, review and updating patient chart, medical decision making and response to patient.  ° °Tammye Kahler W Stevie Charter, NP ° °  °

## 2021-12-26 ENCOUNTER — Ambulatory Visit: Payer: 59

## 2021-12-27 DIAGNOSIS — H9202 Otalgia, left ear: Secondary | ICD-10-CM | POA: Diagnosis not present

## 2021-12-27 DIAGNOSIS — J3089 Other allergic rhinitis: Secondary | ICD-10-CM | POA: Diagnosis not present

## 2021-12-27 DIAGNOSIS — H6993 Unspecified Eustachian tube disorder, bilateral: Secondary | ICD-10-CM | POA: Insufficient documentation

## 2021-12-29 ENCOUNTER — Other Ambulatory Visit: Payer: Self-pay

## 2022-01-02 ENCOUNTER — Telehealth: Payer: 59

## 2022-01-02 ENCOUNTER — Encounter: Payer: Self-pay | Admitting: Medical-Surgical

## 2022-01-03 ENCOUNTER — Telehealth: Payer: 59 | Admitting: Physician Assistant

## 2022-01-03 DIAGNOSIS — U071 COVID-19: Secondary | ICD-10-CM | POA: Diagnosis not present

## 2022-01-03 MED ORDER — ALBUTEROL SULFATE HFA 108 (90 BASE) MCG/ACT IN AERS: 1.0000 | INHALATION_SPRAY | RESPIRATORY_TRACT | 0 refills | Status: DC | PRN

## 2022-01-03 MED ORDER — NIRMATRELVIR/RITONAVIR (PAXLOVID)TABLET: 3.0000 | ORAL_TABLET | Freq: Two times a day (BID) | ORAL | 0 refills | Status: AC

## 2022-01-03 NOTE — Patient Instructions (Signed)
Tula Nakayama, thank you for joining Margaretann Loveless, PA-C for today's virtual visit.  While this provider is not your primary care provider (PCP), if your PCP is located in our provider database this encounter information will be shared with them immediately following your visit.   A [facility] MyChart account gives you access to today's visit and all your visits, tests, and labs performed at [facility] " click here if you don't have a [facility] MyChart account or go to [email or web address]  Consent: (Patient) Bryan Jackson provided verbal consent for this virtual visit at the beginning of the encounter.  Current Medications:  Current Outpatient Medications:    nirmatrelvir/ritonavir EUA (PAXLOVID) 20 x 150 MG & 10 x 100MG  TABS, Take 3 tablets by mouth 2 (two) times daily for 5 days. (Take nirmatrelvir 150 mg two tablets twice daily for 5 days and ritonavir 100 mg one tablet twice daily for 5 days) Patient GFR is 114, Disp: 30 tablet, Rfl: 0   albuterol (VENTOLIN HFA) 108 (90 Base) MCG/ACT inhaler, Inhale 1-2 puffs into the lungs every 4 (four) hours as needed for wheezing or shortness of breath., Disp: 8 g, Rfl: 0   Azelastine-Fluticasone 137-50 MCG/ACT SUSP, Place 1 spray into the nose every 12 (twelve) hours., Disp: 23 g, Rfl: 0   Spacer/Aero-Holding Chambers (AEROCHAMBER PLUS) inhaler, Use with inhaler, Disp: 1 each, Rfl: 2   Medications ordered in this encounter:  Meds ordered this encounter  Medications   nirmatrelvir/ritonavir EUA (PAXLOVID) 20 x 150 MG & 10 x 100MG  TABS    Sig: Take 3 tablets by mouth 2 (two) times daily for 5 days. (Take nirmatrelvir 150 mg two tablets twice daily for 5 days and ritonavir 100 mg one tablet twice daily for 5 days) Patient GFR is 114    Dispense:  30 tablet    Refill:  0    Order Specific Question:   Supervising Provider    Answer:      albuterol (VENTOLIN HFA) 108 (90 Base) MCG/ACT inhaler     Sig: Inhale 1-2 puffs into the lungs every 4 (four) hours as needed for wheezing or shortness of breath.    Dispense:  8 g    Refill:  0    Order Specific Question:   Supervising Provider    Answer:   Merrilee Jansky [[id number]]     *If you need refills on other medications prior to your next appointment, please contact your pharmacy*  Follow-Up: Call back or seek an in-person evaluation if the symptoms worsen or if the condition fails to improve as anticipated.  [facility] [phone]  Other Instructions  COVID-19 COVID-19, or coronavirus disease 2019, is an infection that is caused by a new (novel) coronavirus called SARS-CoV-2. COVID-19 can cause many symptoms. In some people, the virus may not cause any symptoms. In others, it may cause mild or severe symptoms. Some people with severe infection develop severe disease. What are the causes? This illness is caused by a virus. The virus may be in the air as tiny specks of fluid (aerosols) or droplets, or it may be on surfaces. You may catch the virus by: Breathing in droplets from an infected person. Droplets can be spread by a person breathing, speaking, singing, coughing, or sneezing. Touching something, like a table or a doorknob, that has virus on it (is contaminated) and then touching your mouth, nose, or eyes. What increases the risk? Risk for  infection: You are more likely to get infected with the COVID-19 virus if: You are within 6 ft (1.8 m) of a person with COVID-19 for 15 minutes or longer. You are providing care for a person who is infected with COVID-19. You are in close personal contact with other people. Close personal contact includes hugging, kissing, or sharing eating or drinking utensils. Risk for serious illness caused by COVID-19: You are more likely to get seriously ill from the COVID-19 virus if: You have cancer. You have a long-term (chronic) disease, such as: Chronic lung disease. This  includes pulmonary embolism, chronic obstructive pulmonary disease, and cystic fibrosis. Long-term disease that lowers your body's ability to fight infection (immunocompromise). Serious cardiac conditions, such as heart failure, coronary artery disease, or cardiomyopathy. Diabetes. Chronic kidney disease. Liver diseases. These include cirrhosis, nonalcoholic fatty liver disease, alcoholic liver disease, or autoimmune hepatitis. You have obesity. You are pregnant or were recently pregnant. You have sickle cell disease. What are the signs or symptoms? Symptoms of this condition can range from mild to severe. Symptoms may appear any time from 2 to 14 days after being exposed to the virus. They include: Fever or chills. Shortness of breath or trouble breathing. Feeling tired or very tired. Headaches, body aches, or muscle aches. Runny or stuffy nose, sneezing, coughing, or sore throat. New loss of taste or smell. This is rare. Some people may also have stomach problems, such as nausea, vomiting, or diarrhea. Other people may not have any symptoms of COVID-19. How is this diagnosed? This condition may be diagnosed by testing samples to check for the COVID-19 virus. The most common tests are the PCR test and the antigen test. Tests may be done in the lab or at home. They include: Using a swab to take a sample of fluid from the back of your nose and throat (nasopharyngeal fluid), from your nose, or from your throat. Testing a sample of saliva from your mouth. Testing a sample of coughed-up mucus from your lungs (sputum). How is this treated? Treatment for COVID-19 infection depends on the severity of the condition. Mild symptoms can be managed at home with rest, fluids, and over-the-counter medicines. Serious symptoms may be treated in a hospital intensive care unit (ICU). Treatment in the ICU may include: Supplemental oxygen. Extra oxygen is given through a tube in the nose, a face mask, or a  hood. Medicines. These may include: Antivirals, such as monoclonal antibodies. These help your body fight off certain viruses that can cause disease. Anti-inflammatories, such as corticosteroids. These reduce inflammation and suppress the immune system. Antithrombotics. These prevent or treat blood clots, if they develop. Convalescent plasma. This helps boost your immune system, if you have an underlying immunosuppressive condition or are getting immunosuppressive treatments. Prone positioning. This means you will lie on your stomach. This helps oxygen to get into your lungs. Infection control measures. If you are at risk for more serious illness caused by COVID-19, your health care provider may prescribe two long-acting monoclonal antibodies, given together every 6 months. How is this prevented? To protect yourself: Use preventive medicine (pre-exposure prophylaxis). You may get pre-exposure prophylaxis if you have moderate or severe immunocompromise. Get vaccinated. Anyone 2 months old or older who meets guidelines can get a COVID-19 vaccine or vaccine series. This includes people who are pregnant or making breast milk (lactating). Get an added dose of COVID-19 vaccine after your first vaccine or vaccine series if you have moderate to severe immunocompromise. This applies if  you have had a solid organ transplant or have been diagnosed with an immunocompromising condition. You should get the added dose 4 weeks after you got the first COVID-19 vaccine or vaccine series. If you get an mRNA vaccine, you will need a 3-dose primary series. If you get the J&J/Janssen vaccine, you will need a 2-dose primary series, with the second dose being an mRNA vaccine. Talk to your health care provider about getting experimental monoclonal antibodies. This treatment is approved under emergency use authorization to prevent severe illness before or after being exposed to the COVID-19 virus. You may be given monoclonal  antibodies if: You have moderate or severe immunocompromise. This includes treatments that lower your immune response. People with immunocompromise may not develop protection against COVID-19 when they are vaccinated. You cannot be vaccinated. You may not get a vaccine if you have a severe allergic reaction to the vaccine or its components. You are not fully vaccinated. You are in a facility where COVID-19 is present and: Are in close contact with a person who is infected with the COVID-19 virus. Are at high risk of being exposed to the COVID-19 virus. You are at risk of illness from new variants of the COVID-19 virus. To protect others: If you have symptoms of COVID-19, take steps to prevent the virus from spreading to others. Stay home. Leave your house only to get medical care. Do not use public transit, if possible. Do not travel while you are sick. Wash your hands often with soap and water for at least 20 seconds. If soap and water are not available, use alcohol-based hand sanitizer. Make sure that all people in your household wash their hands well and often. Cough or sneeze into a tissue or your sleeve or elbow. Do not cough or sneeze into your hand or into the air. Where to find more information Centers for Disease Control and Prevention: https://www.clark-whitaker.org/ World Health Organization: https://thompson-craig.com/ Get help right away if: You have trouble breathing. You have pain or pressure in your chest. You are confused. You have bluish lips and fingernails. You have trouble waking from sleep. You have symptoms that get worse. These symptoms may be an emergency. Get help right away. Call 911. Do not wait to see if the symptoms will go away. Do not drive yourself to the hospital. Summary COVID-19 is an infection that is caused by a new coronavirus. Sometimes, there are no symptoms. Other times, symptoms range from mild to severe. Some people with a severe COVID-19  infection develop severe disease. The virus that causes COVID-19 can spread from person to person through droplets or aerosols from breathing, speaking, singing, coughing, or sneezing. Mild symptoms of COVID-19 can be managed at home with rest, fluids, and over-the-counter medicines. This information is not intended to replace advice given to you by your health care provider. Make sure you discuss any questions you have with your health care provider. Document Revised: 01/03/2021 Document Reviewed: 01/05/2021 Elsevier Patient Education  2023 Elsevier Inc.    If you have been instructed to have an in-person evaluation today at a local Urgent Care facility, please use the link below. It will take you to a list of all of our available [facility], including address, phone number and hours of operation. Please do not delay care.  [facility]  If you or a family member do not have a primary care provider, use the link below to schedule a visit and establish care. When you choose  a [facility] primary care physician or advanced practice provider, you gain a long-term partner in health. Find a Primary Care Provider  Learn more about [facility]'s in-office and virtual care options: [facility]

## 2022-01-03 NOTE — Progress Notes (Signed)
Virtual Visit Consent   Bryan Jackson, you are scheduled for a virtual visit with a [facility] provider today. Just as with appointments in the office, your consent must be obtained to participate. Your consent will be active for this visit and any virtual visit you may have with one of our providers in the next 365 days. If you have a MyChart account, a copy of this consent can be sent to you electronically.  As this is a virtual visit, video technology does not allow for your provider to perform a traditional examination. This may limit your provider's ability to fully assess your condition. If your provider identifies any concerns that need to be evaluated in person or the need to arrange testing (such as labs, EKG, etc.), we will make arrangements to do so. Although advances in technology are sophisticated, we cannot ensure that it will always work on either your end or our end. If the connection with a video visit is poor, the visit may have to be switched to a telephone visit. With either a video or telephone visit, we are not always able to ensure that we have a secure connection.  By engaging in this virtual visit, you consent to the provision of healthcare and authorize for your insurance to be billed (if applicable) for the services provided during this visit. Depending on your insurance coverage, you may receive a charge related to this service.  I need to obtain your verbal consent now. Are you willing to proceed with your visit today? Bryan Jackson has provided verbal consent on 01/03/2022 for a virtual visit (video or telephone). Margaretann Loveless, PA-C  Date: 01/03/2022 9:10 AM  Virtual Visit via Video Note   I, Margaretann Loveless, connected with  Bryan Jackson  ([id number], 1985-03-06) on 01/03/22 at  9:00 AM EST by a video-enabled telemedicine application and verified that I am speaking with the correct person using two identifiers.  Location: Patient: Virtual Visit  Location Patient: Home Provider: Virtual Visit Location Provider: Home Office   I discussed the limitations of evaluation and management by telemedicine and the availability of in person appointments. The patient expressed understanding and agreed to proceed.    History of Present Illness: Bryan Jackson is a 36 y.o. who identifies as a male who was assigned male at birth, and is being seen today for Covid 70.  HPI: URI  This is a new problem. Episode onset: Tested postive for covid 19 yesterday on at home test; Symptoms started on Monday. The problem has been gradually worsening. There has been no fever. Associated symptoms include congestion, diarrhea (mild MOnday, now improved), headaches, nausea (mild), a plugged ear sensation, rhinorrhea, sinus pain and a sore throat. Pertinent negatives include no coughing, ear pain or vomiting. Associated symptoms comments: Fatigue, body aches, mild SOB, chills yesterday now improved. He has tried acetaminophen, NSAIDs and increased fluids for the symptoms. The treatment provided no relief.     Problems:  Patient Active Problem List   Diagnosis Date Noted   Fluctuating blood pressure 04/06/2020   Excessive daytime sleepiness 04/06/2020    Allergies: No Known Allergies Medications:  Current Outpatient Medications:    nirmatrelvir/ritonavir EUA (PAXLOVID) 20 x 150 MG & 10 x 100MG  TABS, Take 3 tablets by mouth 2 (two) times daily for 5 days. (Take nirmatrelvir 150 mg two tablets twice daily for 5 days and ritonavir 100 mg one tablet twice daily for 5 days) Patient GFR is 114, Disp: 30  tablet, Rfl: 0   albuterol (VENTOLIN HFA) 108 (90 Base) MCG/ACT inhaler, Inhale 1-2 puffs into the lungs every 4 (four) hours as needed for wheezing or shortness of breath., Disp: 8 g, Rfl: 0   Azelastine-Fluticasone 137-50 MCG/ACT SUSP, Place 1 spray into the nose every 12 (twelve) hours., Disp: 23 g, Rfl: 0   Spacer/Aero-Holding Chambers (AEROCHAMBER PLUS) inhaler, Use  with inhaler, Disp: 1 each, Rfl: 2  Observations/Objective: Patient is well-developed, well-nourished in no acute distress.  Resting comfortably at home.  Head is normocephalic, atraumatic.  No labored breathing.  Speech is clear and coherent with logical content.  Patient is alert and oriented at baseline.    Assessment and Plan: 1. COVID-19 - nirmatrelvir/ritonavir EUA (PAXLOVID) 20 x 150 MG & 10 x 100MG  TABS; Take 3 tablets by mouth 2 (two) times daily for 5 days. (Take nirmatrelvir 150 mg two tablets twice daily for 5 days and ritonavir 100 mg one tablet twice daily for 5 days) Patient GFR is 114  Dispense: 30 tablet; Refill: 0 - albuterol (VENTOLIN HFA) 108 (90 Base) MCG/ACT inhaler; Inhale 1-2 puffs into the lungs every 4 (four) hours as needed for wheezing or shortness of breath.  Dispense: 8 g; Refill: 0  - Continue OTC symptomatic management of choice - Will send OTC vitamins and supplement information through AVS - Paxlovid and albuterol prescribed - Patient enrolled in MyChart symptom monitoring - Push fluids - Rest as needed - Discussed return precautions and when to seek in-person evaluation, sent via AVS as well   Follow Up Instructions: I discussed the assessment and treatment plan with the patient. The patient was provided an opportunity to ask questions and all were answered. The patient agreed with the plan and demonstrated an understanding of the instructions.  A copy of instructions were sent to the patient via MyChart unless otherwise noted below.    The patient was advised to call back or seek an in-person evaluation if the symptoms worsen or if the condition fails to improve as anticipated.  Time:  I spent 10 minutes with the patient via telehealth technology discussing the above problems/concerns.    , PA-C

## 2022-01-08 ENCOUNTER — Other Ambulatory Visit: Payer: Self-pay

## 2022-01-23 ENCOUNTER — Encounter: Payer: 59 | Admitting: Medical-Surgical

## 2022-01-25 ENCOUNTER — Encounter: Payer: 59 | Admitting: Medical-Surgical

## 2022-01-31 ENCOUNTER — Telehealth: Payer: Commercial Managed Care - PPO | Admitting: Physician Assistant

## 2022-01-31 DIAGNOSIS — H9202 Otalgia, left ear: Secondary | ICD-10-CM | POA: Diagnosis not present

## 2022-01-31 DIAGNOSIS — H6690 Otitis media, unspecified, unspecified ear: Secondary | ICD-10-CM

## 2022-01-31 MED ORDER — AMOXICILLIN-POT CLAVULANATE 875-125 MG PO TABS: 1.0000 | ORAL_TABLET | Freq: Two times a day (BID) | ORAL | 0 refills | Status: DC

## 2022-01-31 MED ORDER — NEOMYCIN-POLYMYXIN-HC 3.5-10000-1 OT SOLN: 3.0000 [drp] | Freq: Four times a day (QID) | OTIC | 0 refills | Status: DC

## 2022-01-31 NOTE — Progress Notes (Signed)

## 2022-02-21 ENCOUNTER — Ambulatory Visit: Payer: Commercial Managed Care - PPO

## 2022-03-13 IMAGING — DX DG RIBS 2V*L*
4 series · 4 of 4 positions shown · non-contrast
Comparison: Chest x-ray 03/26/2021

CLINICAL DATA: Left rib pain

EXAM:
LEFT RIBS - 2 VIEW

[rib pa (1 of 2)]
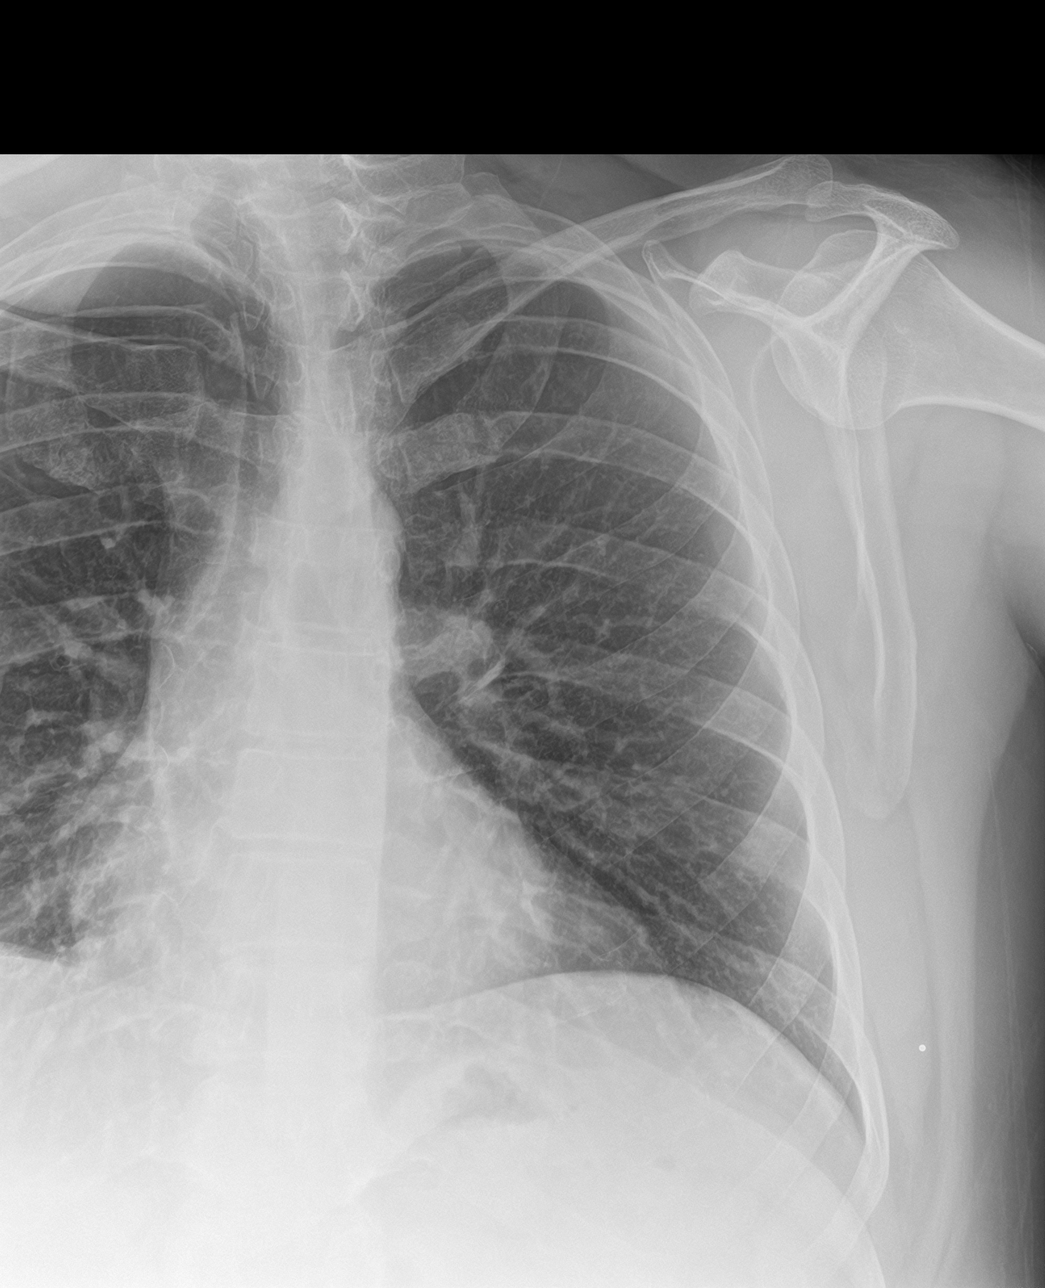

[rib obl]
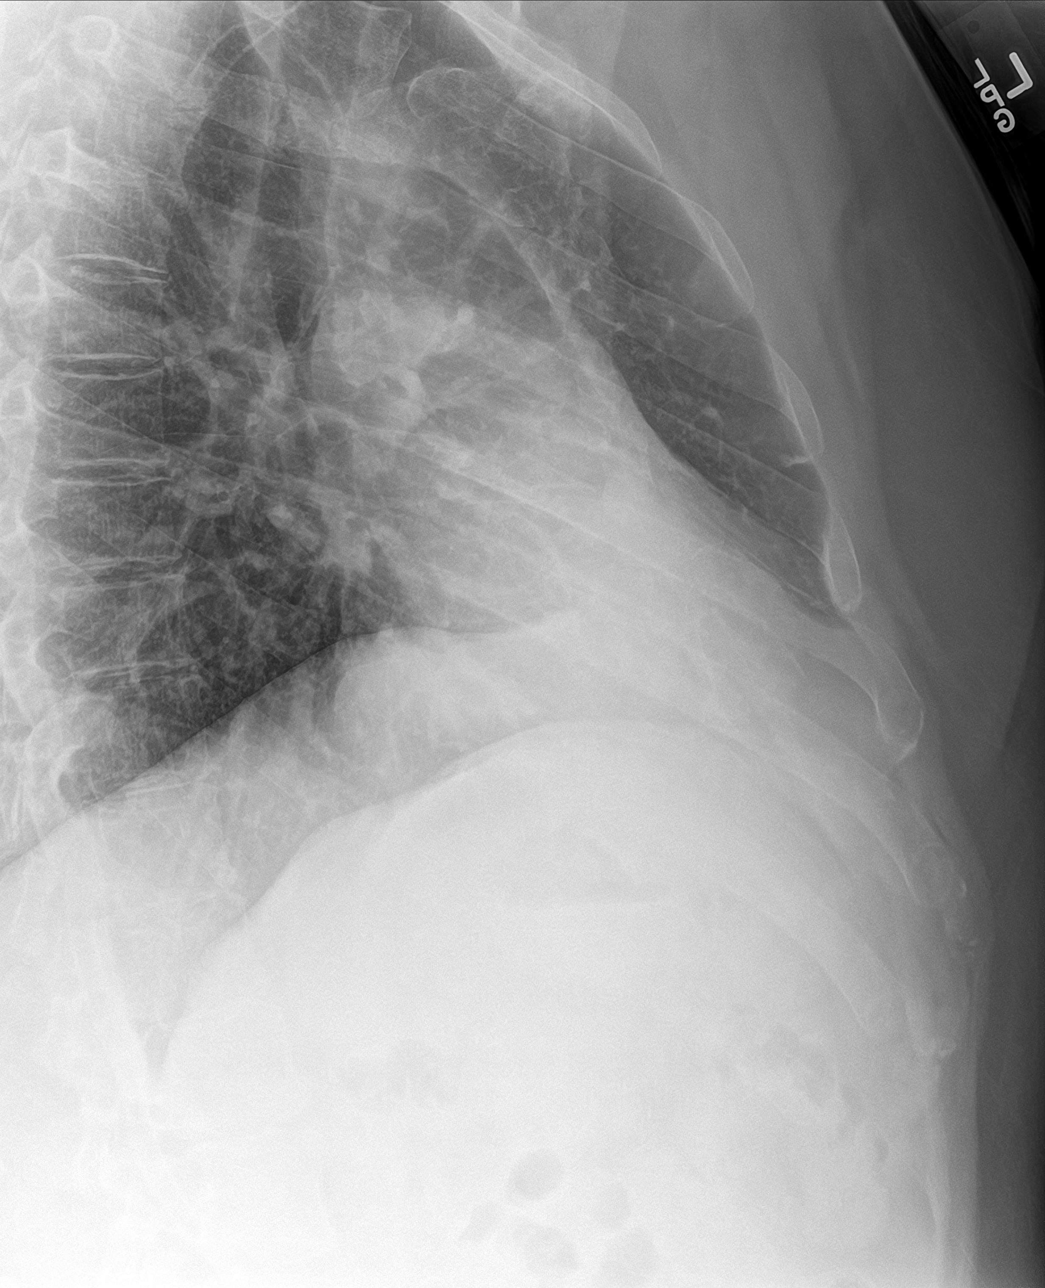

[chest pa]
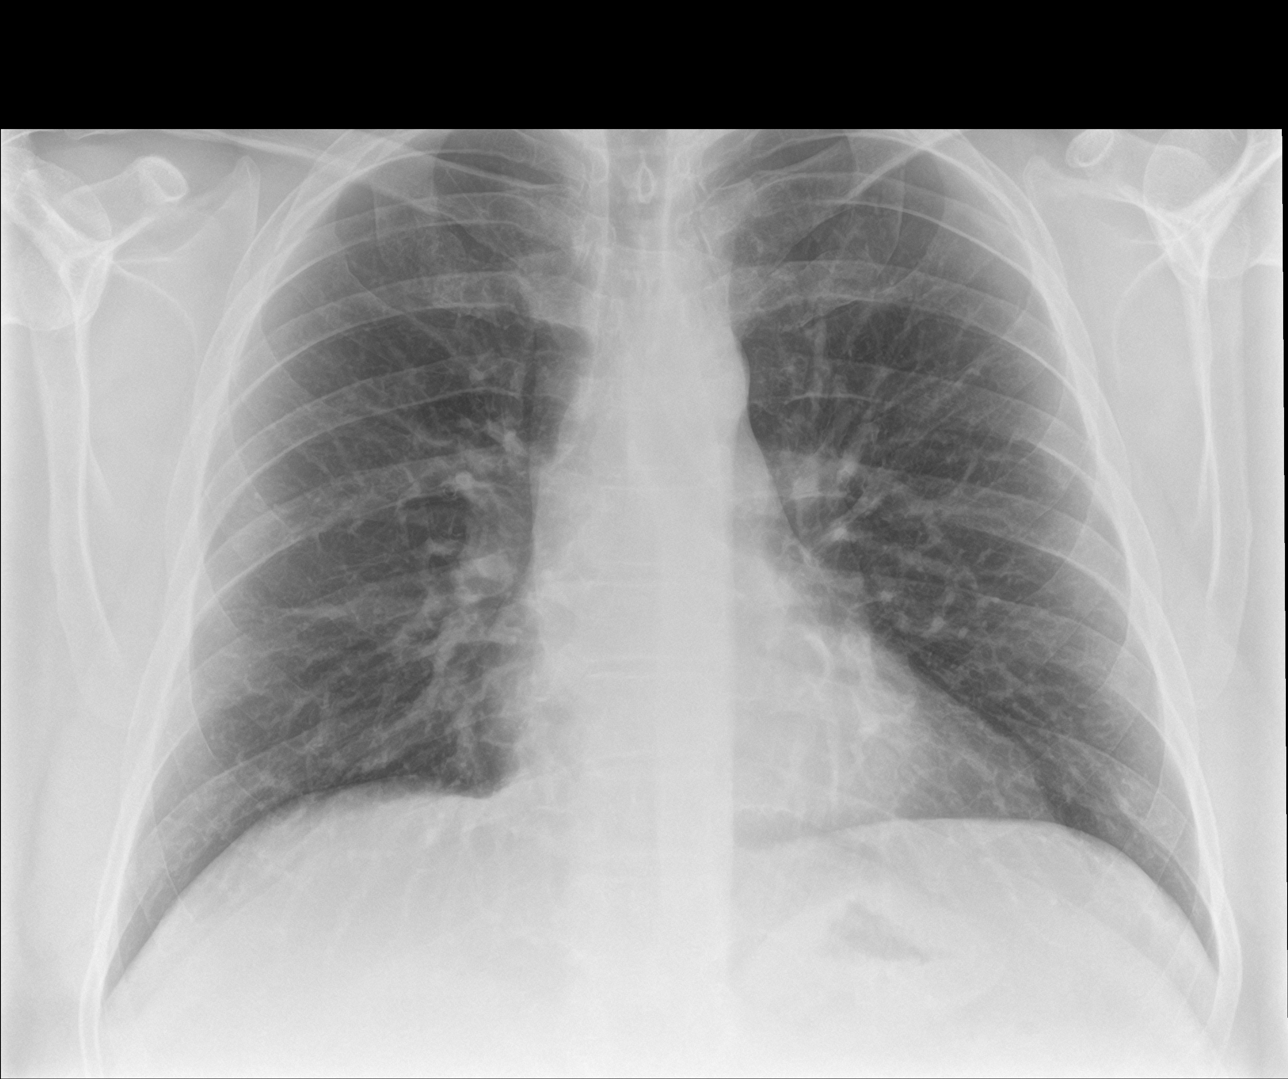

[rib pa (2 of 2)]
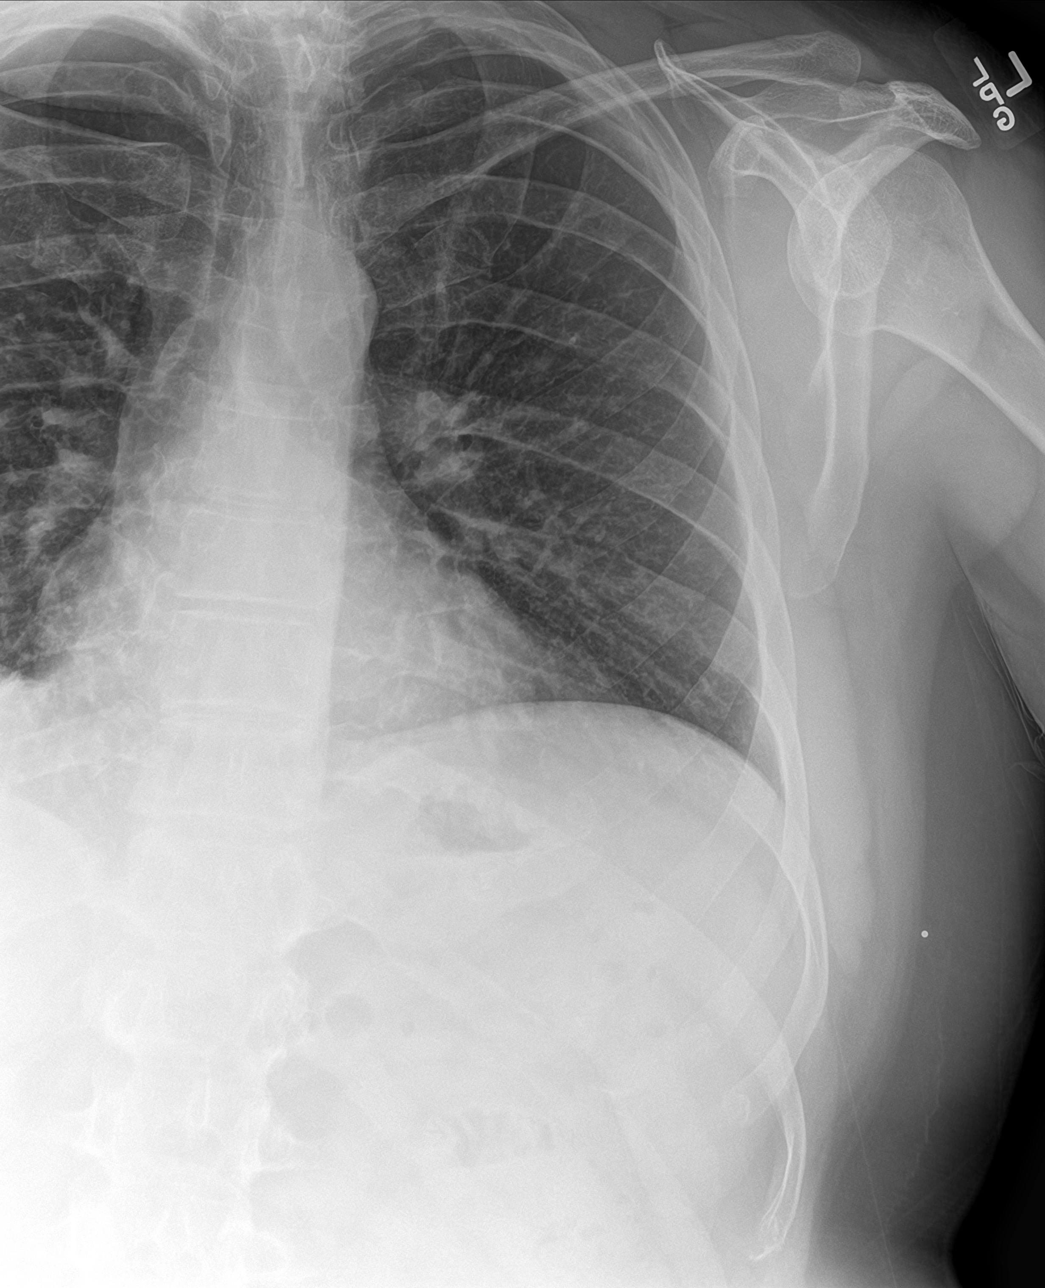

[4 of 4 positions shown; findings below may reference images not displayed]

FINDINGS: No fracture or other bone lesions are seen involving the ribs. There
is no evidence of pneumothorax or pleural effusion. Both lungs are
clear. Heart size and mediastinal contours are within normal limits.
IMPRESSION: No acute process identified.

## 2022-03-23 ENCOUNTER — Telehealth: Payer: Commercial Managed Care - PPO | Admitting: Physician Assistant

## 2022-03-23 DIAGNOSIS — J019 Acute sinusitis, unspecified: Secondary | ICD-10-CM | POA: Diagnosis not present

## 2022-03-23 DIAGNOSIS — B9689 Other specified bacterial agents as the cause of diseases classified elsewhere: Secondary | ICD-10-CM | POA: Diagnosis not present

## 2022-03-23 MED ORDER — DOXYCYCLINE HYCLATE 100 MG PO TABS: 100.0000 mg | ORAL_TABLET | Freq: Two times a day (BID) | ORAL | 0 refills | Status: DC

## 2022-03-23 NOTE — Progress Notes (Signed)
E-Visit for Sinus Problems  We are sorry that you are not feeling well.  Here is how we plan to help!  Based on what you have shared with me it looks like you have sinusitis.  Sinusitis is inflammation and infection in the sinus cavities of the head.  Based on your presentation I believe you most likely have Acute Bacterial Sinusitis.  This is an infection caused by bacteria and is treated with antibiotics. I have prescribed Doxycycline 100mg by mouth twice a day for 10 days. You may use an oral decongestant such as Mucinex D or if you have glaucoma or high blood pressure use plain Mucinex. Saline nasal spray help and can safely be used as often as needed for congestion.  If you develop worsening sinus pain, fever or notice severe headache and vision changes, or if symptoms are not better after completion of antibiotic, please schedule an appointment with a health care provider.    Sinus infections are not as easily transmitted as other respiratory infection, however we still recommend that you avoid close contact with loved ones, especially the very young and elderly.  Remember to wash your hands thoroughly throughout the day as this is the number one way to prevent the spread of infection!  Home Care: Only take medications as instructed by your medical team. Complete the entire course of an antibiotic. Do not take these medications with alcohol. A steam or ultrasonic humidifier can help congestion.  You can place a towel over your head and breathe in the steam from hot water coming from a faucet. Avoid close contacts especially the very young and the elderly. Cover your mouth when you cough or sneeze. Always remember to wash your hands.  Get Help Right Away If: You develop worsening fever or sinus pain. You develop a severe head ache or visual changes. Your symptoms persist after you have completed your treatment plan.  Make sure you Understand these instructions. Will watch your  condition. Will get help right away if you are not doing well or get worse.  Thank you for choosing an e-visit.  Your e-visit answers were reviewed by a board certified advanced clinical practitioner to complete your personal care plan. Depending upon the condition, your plan could have included both over the counter or prescription medications.  Please review your pharmacy choice. Make sure the pharmacy is open so you can pick up prescription now. If there is a problem, you may contact your provider through MyChart messaging and have the prescription routed to another pharmacy.  Your safety is important to us. If you have drug allergies check your prescription carefully.   For the next 24 hours you can use MyChart to ask questions about today's visit, request a non-urgent call back, or ask for a work or school excuse. You will get an email in the next two days asking about your experience. I hope that your e-visit has been valuable and will speed your recovery.  I have spent 5 minutes in review of e-visit questionnaire, review and updating patient chart, medical decision making and response to patient.   Zakari Bathe M Rad Gramling, PA-C  

## 2022-03-25 ENCOUNTER — Telehealth: Payer: Commercial Managed Care - PPO | Admitting: Physician Assistant

## 2022-03-25 DIAGNOSIS — R058 Other specified cough: Secondary | ICD-10-CM | POA: Diagnosis not present

## 2022-03-25 MED ORDER — PREDNISONE 20 MG PO TABS: 40.0000 mg | ORAL_TABLET | Freq: Every day | ORAL | 0 refills | Status: DC

## 2022-03-25 NOTE — Progress Notes (Signed)
Virtual Visit Consent   Bryan Jackson, you are scheduled for a virtual visit with a [facility] provider today. Just as with appointments in the office, your consent must be obtained to participate. Your consent will be active for this visit and any virtual visit you may have with one of our providers in the next 365 days. If you have a MyChart account, a copy of this consent can be sent to you electronically.  As this is a virtual visit, video technology does not allow for your provider to perform a traditional examination. This may limit your provider's ability to fully assess your condition. If your provider identifies any concerns that need to be evaluated in person or the need to arrange testing (such as labs, EKG, etc.), we will make arrangements to do so. Although advances in technology are sophisticated, we cannot ensure that it will always work on either your end or our end. If the connection with a video visit is poor, the visit may have to be switched to a telephone visit. With either a video or telephone visit, we are not always able to ensure that we have a secure connection.  By engaging in this virtual visit, you consent to the provision of healthcare and authorize for your insurance to be billed (if applicable) for the services provided during this visit. Depending on your insurance coverage, you may receive a charge related to this service.  I need to obtain your verbal consent now. Are you willing to proceed with your visit today? Bryan Jackson has provided verbal consent on 03/25/2022 for a virtual visit (video or telephone). Lenise Arena Ward, PA-C  Date: 03/25/2022 12:06 PM  Virtual Visit via Video Note   I, Lenise Arena Ward, connected with  Bryan Jackson  ([id number], 07-12-1985) on 03/25/22 at 12:00 PM EST by a video-enabled telemedicine application and verified that I am speaking with the correct person using two identifiers.  Location: Patient: Virtual Visit Location  Patient: Home Provider: Virtual Visit Location Provider: Home Office   I discussed the limitations of evaluation and management by telemedicine and the availability of in person appointments. The patient expressed understanding and agreed to proceed.    History of Present Illness: Bryan Jackson is a 37 y.o. who identifies as a male who was assigned male at birth, and is being seen today for cough.  Reports he experienced cold sx about two weeks ago, those sx have improved.  Now with persistent non productive cough. He is currently taking robitussin, mucinex, tylenol.  Reports some inspiratory wheezing.  Cough is worse at night when lying flat. Pt report no h/o  asthma.   HPI: HPI  Problems:  Patient Active Problem List   Diagnosis Date Noted   Fluctuating blood pressure 04/06/2020   Excessive daytime sleepiness 04/06/2020    Allergies: No Known Allergies Medications:  Current Outpatient Medications:    predniSONE (DELTASONE) 20 MG tablet, Take 2 tablets (40 mg total) by mouth daily with breakfast for 5 days., Disp: 10 tablet, Rfl: 0   albuterol (VENTOLIN HFA) 108 (90 Base) MCG/ACT inhaler, Inhale 1-2 puffs into the lungs every 4 (four) hours as needed for wheezing or shortness of breath., Disp: 8 g, Rfl: 0   Azelastine-Fluticasone 137-50 MCG/ACT SUSP, Place 1 spray into the nose every 12 (twelve) hours., Disp: 23 g, Rfl: 0   doxycycline (VIBRA-TABS) 100 MG tablet, Take 1 tablet (100 mg total) by mouth 2 (two) times daily., Disp: 20 tablet, Rfl:  0   neomycin-polymyxin-hydrocortisone (CORTISPORIN) OTIC solution, Place 3 drops into the left ear 4 (four) times daily. X 7 days, Disp: 10 mL, Rfl: 0   Spacer/Aero-Holding Chambers (AEROCHAMBER PLUS) inhaler, Use with inhaler, Disp: 1 each, Rfl: 2  Observations/Objective: Patient is well-developed, well-nourished in no acute distress.  Resting comfortably at home.  Head is normocephalic, atraumatic.  No labored breathing.  Speech is clear  and coherent with logical content.  Patient is alert and oriented at baseline.    Assessment and Plan: 1. Post-viral cough syndrome - predniSONE (DELTASONE) 20 MG tablet; Take 2 tablets (40 mg total) by mouth daily with breakfast for 5 days.  Dispense: 10 tablet; Refill: 0  Pt in no acute distress, speaking in complete sentences.  In person evaluation precautions discussed.   Follow Up Instructions: I discussed the assessment and treatment plan with the patient. The patient was provided an opportunity to ask questions and all were answered. The patient agreed with the plan and demonstrated an understanding of the instructions.  A copy of instructions were sent to the patient via MyChart unless otherwise noted below.     The patient was advised to call back or seek an in-person evaluation if the symptoms worsen or if the condition fails to improve as anticipated.  Time:  I spent 6 minutes with the patient via telehealth technology discussing the above problems/concerns.    Lenise Arena Ward, PA-C

## 2022-03-25 NOTE — Patient Instructions (Signed)
  Shelah Lewandowsky, thank you for joining [place], PA-C for today's virtual visit.  While this provider is not your primary care provider (PCP), if your PCP is located in our provider database this encounter information will be shared with them immediately following your visit.   [facility] account gives you access to today's visit and all your visits, tests, and labs performed at [facility] " click here if you don't have a [facility] account or go to [email or web address]  Consent: (Patient) Bryan Jackson provided verbal consent for this virtual visit at the beginning of the encounter.  Current Medications:  Current Outpatient Medications:    predniSONE (DELTASONE) 20 MG tablet, Take 2 tablets (40 mg total) by mouth daily with breakfast for 5 days., Disp: 10 tablet, Rfl: 0   albuterol (VENTOLIN HFA) 108 (90 Base) MCG/ACT inhaler, Inhale 1-2 puffs into the lungs every 4 (four) hours as needed for wheezing or shortness of breath., Disp: 8 g, Rfl: 0   Azelastine-Fluticasone 137-50 MCG/ACT SUSP, Place 1 spray into the nose every 12 (twelve) hours., Disp: 23 g, Rfl: 0   doxycycline (VIBRA-TABS) 100 MG tablet, Take 1 tablet (100 mg total) by mouth 2 (two) times daily., Disp: 20 tablet, Rfl: 0   neomycin-polymyxin-hydrocortisone (CORTISPORIN) OTIC solution, Place 3 drops into the left ear 4 (four) times daily. X 7 days, Disp: 10 mL, Rfl: 0   Spacer/Aero-Holding Chambers (AEROCHAMBER PLUS) inhaler, Use with inhaler, Disp: 1 each, Rfl: 2   Medications ordered in this encounter:  Meds ordered this encounter  Medications   predniSONE (DELTASONE) 20 MG tablet    Sig: Take 2 tablets (40 mg total) by mouth daily with breakfast for 5 days.    Dispense:  10 tablet    Refill:  0    Order Specific Question:   Supervising Provider    Answer:   Chase Picket [id number]     *If you need refills on other medications prior to your next appointment, please  contact your pharmacy*  Follow-Up: Call back or seek an in-person evaluation if the symptoms worsen or if the condition fails to improve as anticipated.  [place] [phone]  Other Instructions Take prednisone as prescribed.  Recommend continued use of Mucinex and tessalon.  If you develop worsening symptoms follow up with PCP or Urgent Care for in person evaluation.    If you have been instructed to have an in-person evaluation today at a local Urgent Care facility, please use the link below. It will take you to a list of all of our available [facility], including address, phone number and hours of operation. Please do not delay care.  [facility]  If you or a family member do not have a primary care provider, use the link below to schedule a visit and establish care. When you choose a [facility] primary care physician or advanced practice provider, you gain a long-term partner in health. Find a Primary Care Provider  Learn more about [facility]'s in-office and virtual care options: [facility]

## 2022-03-27 ENCOUNTER — Telehealth: Payer: Commercial Managed Care - PPO | Admitting: Nurse Practitioner

## 2022-03-27 ENCOUNTER — Other Ambulatory Visit (HOSPITAL_BASED_OUTPATIENT_CLINIC_OR_DEPARTMENT_OTHER): Payer: Self-pay

## 2022-03-27 ENCOUNTER — Other Ambulatory Visit: Payer: Self-pay | Admitting: Nurse Practitioner

## 2022-03-27 ENCOUNTER — Other Ambulatory Visit: Payer: Self-pay | Admitting: Family Medicine

## 2022-03-27 DIAGNOSIS — J40 Bronchitis, not specified as acute or chronic: Secondary | ICD-10-CM

## 2022-03-27 DIAGNOSIS — R058 Other specified cough: Secondary | ICD-10-CM

## 2022-03-27 MED ORDER — BENZONATATE 100 MG PO CAPS: 100.0000 mg | ORAL_CAPSULE | Freq: Three times a day (TID) | ORAL | 0 refills | Status: DC | PRN

## 2022-03-27 MED ORDER — PREDNISONE 10 MG (21) PO TBPK: ORAL_TABLET | ORAL | 0 refills | Status: DC

## 2022-03-27 MED ORDER — PREDNISONE 10 MG PO TABS
ORAL_TABLET | ORAL | 0 refills | Status: DC
  Filled 2022-03-27 – 2022-04-12 (×3): qty 21, 6d supply, fill #0

## 2022-03-27 NOTE — Progress Notes (Signed)
We are sorry that you are not feeling well.  Here is how we plan to help!  Based on your presentation I believe you most likely have A cough due to bacteria.  When patients have a fever and a productive cough with a change in color or increased sputum production, we are concerned about bacterial bronchitis.  If left untreated it can progress to pneumonia. The antibiotic that you were prescribed for your sinus infection (Doxycycline) should cover your bronchitis as well.    In addition you may use A prescription cough medication called Tessalon Perles '100mg'$ . You may take 1-2 capsules every 8 hours as needed for your cough.  Prednisone 10 mg daily for 6 days (see taper instructions below) Assure you are continuing to use your Albuterol inhaler as well up to every 4-6  hours as needed for cough/wheezing  Meds ordered this encounter  Medications   predniSONE (STERAPRED UNI-PAK 21 TAB) 10 MG (21) TBPK tablet    Sig: Take 6 tablets on day one, 5 on day two, 4 on day three, 3 on day four, 2 on day five, and 1 on day six. Take with food.    Dispense:  21 tablet    Refill:  0   benzonatate (TESSALON) 100 MG capsule    Sig: Take 1 capsule (100 mg total) by mouth 3 (three) times daily as needed.    Dispense:  30 capsule    Refill:  0     From your responses in the eVisit questionnaire you describe inflammation in the upper respiratory tract which is causing a significant cough.  This is commonly called Bronchitis and has four common causes:   Allergies Viral Infections Acid Reflux Bacterial Infection Allergies, viruses and acid reflux are treated by controlling symptoms or eliminating the cause. An example might be a cough caused by taking certain blood pressure medications. You stop the cough by changing the medication. Another example might be a cough caused by acid reflux. Controlling the reflux helps control the cough.  USE OF BRONCHODILATOR ("RESCUE") INHALERS: There is a risk from using your  bronchodilator too frequently.  The risk is that over-reliance on a medication which only relaxes the muscles surrounding the breathing tubes can reduce the effectiveness of medications prescribed to reduce swelling and congestion of the tubes themselves.  Although you feel brief relief from the bronchodilator inhaler, your asthma may actually be worsening with the tubes becoming more swollen and filled with mucus.  This can delay other crucial treatments, such as oral steroid medications. If you need to use a bronchodilator inhaler daily, several times per day, you should discuss this with your provider.  There are probably better treatments that could be used to keep your asthma under control.     HOME CARE Only take medications as instructed by your medical team. Complete the entire course of an antibiotic. Drink plenty of fluids and get plenty of rest. Avoid close contacts especially the very young and the elderly Cover your mouth if you cough or cough into your sleeve. Always remember to wash your hands A steam or ultrasonic humidifier can help congestion.   GET HELP RIGHT AWAY IF: You develop worsening fever. You become short of breath You cough up blood. Your symptoms persist after you have completed your treatment plan MAKE SURE YOU  Understand these instructions. Will watch your condition. Will get help right away if you are not doing well or get worse.    Thank you for choosing  an e-visit.  Your e-visit answers were reviewed by a board certified advanced clinical practitioner to complete your personal care plan. Depending upon the condition, your plan could have included both over the counter or prescription medications.  Please review your pharmacy choice. Make sure the pharmacy is open so you can pick up prescription now. If there is a problem, you may contact your provider through [employer] and have the prescription routed to another pharmacy.  Your safety is important  to [place]. If you have drug allergies check your prescription carefully.   For the next 24 hours you can use MyChart to ask questions about today's visit, request a non-urgent call back, or ask for a work or school excuse. You will get an email in the next two days asking about your experience. I hope that your e-visit has been valuable and will speed your recovery.   I spent approximately 5 minutes reviewing the patient's history, current symptoms and coordinating their care today.

## 2022-04-04 ENCOUNTER — Other Ambulatory Visit (HOSPITAL_BASED_OUTPATIENT_CLINIC_OR_DEPARTMENT_OTHER): Payer: Self-pay

## 2022-04-12 ENCOUNTER — Other Ambulatory Visit: Payer: Self-pay

## 2022-04-12 ENCOUNTER — Other Ambulatory Visit (HOSPITAL_COMMUNITY): Payer: Self-pay

## 2022-04-12 ENCOUNTER — Encounter (HOSPITAL_COMMUNITY): Payer: Self-pay

## 2022-04-13 ENCOUNTER — Other Ambulatory Visit: Payer: Self-pay

## 2022-04-13 ENCOUNTER — Ambulatory Visit (INDEPENDENT_AMBULATORY_CARE_PROVIDER_SITE_OTHER): Payer: Commercial Managed Care - PPO | Admitting: Medical-Surgical

## 2022-04-13 ENCOUNTER — Encounter: Payer: Self-pay | Admitting: Medical-Surgical

## 2022-04-13 VITALS — BP 118/79 | HR 96 | Resp 20 | Ht 70.0 in | Wt 258.2 lb

## 2022-04-13 DIAGNOSIS — R058 Other specified cough: Secondary | ICD-10-CM

## 2022-04-13 DIAGNOSIS — Z0000 Encounter for general adult medical examination without abnormal findings: Secondary | ICD-10-CM | POA: Diagnosis not present

## 2022-04-13 DIAGNOSIS — Z131 Encounter for screening for diabetes mellitus: Secondary | ICD-10-CM | POA: Diagnosis not present

## 2022-04-13 DIAGNOSIS — Z1329 Encounter for screening for other suspected endocrine disorder: Secondary | ICD-10-CM | POA: Diagnosis not present

## 2022-04-13 DIAGNOSIS — Z13220 Encounter for screening for lipoid disorders: Secondary | ICD-10-CM | POA: Diagnosis not present

## 2022-04-13 MED ORDER — PREDNISONE 50 MG PO TABS: 50.0000 mg | ORAL_TABLET | Freq: Every day | ORAL | 0 refills | Status: DC

## 2022-04-13 NOTE — Progress Notes (Signed)
Complete physical exam  [name]   DOB: 1985/07/06   36 y.o. Male  MRN: [id number]  Subjective:    Chief Complaint  Patient presents with   Annual Exam   Bryan Jackson is a 37 y.o. male who presents today for a complete physical exam. He reports consuming a general diet.  Running and walking three times a week.  He generally feels well. He reports sleeping well. He does not have additional problems to discuss today.    Most recent fall risk assessment:    04/13/2022   10:10 AM  Fall Risk   Falls in the past year? 0  Number falls in past yr: 0  Injury with Fall? 0  Risk for fall due to : No Fall Risks  Follow up Falls evaluation completed     Most recent depression screenings:    04/13/2022   10:10 AM 07/29/2020    9:20 AM  PHQ 2/9 Scores  PHQ - 2 Score 0 0   Vision:Not within last year , Dental: No current dental problems and No regular dental care , and STD: The patient denies history of sexually transmitted disease.    Patient Care Team: Samuel Bouche, NP as PCP - General (Nurse Practitioner) Lelon Perla, MD as PCP - Cardiology (Cardiology)   Outpatient Medications Prior to Visit  Medication Sig   albuterol (VENTOLIN HFA) 108 (90 Base) MCG/ACT inhaler Inhale 1-2 puffs into the lungs every 4 (four) hours as needed for wheezing or shortness of breath.   Spacer/Aero-Holding Chambers (AEROCHAMBER PLUS) inhaler Use with inhaler   [DISCONTINUED] Azelastine-Fluticasone 137-50 MCG/ACT SUSP Place 1 spray into the nose every 12 (twelve) hours.   [DISCONTINUED] benzonatate (TESSALON) 100 MG capsule Take 1 capsule (100 mg total) by mouth 3 (three) times daily as needed. (Patient not taking: Reported on 04/13/2022)   [DISCONTINUED] doxycycline (VIBRA-TABS) 100 MG tablet Take 1 tablet (100 mg total) by mouth 2 (two) times daily. (Patient not taking: Reported on 04/13/2022)   [DISCONTINUED] neomycin-polymyxin-hydrocortisone (CORTISPORIN) OTIC solution Place 3  drops into the left ear 4 (four) times daily. X 7 days   [DISCONTINUED] predniSONE (DELTASONE) 10 MG tablet Take 6 tablets by mouth on day 1, then 5 tablets on day 2, then 4 tablets on day 3, then 3 tablets on day 4, then 2 tablets on day 5, and then 1 tablet on day 6. Take with food.   No facility-administered medications prior to visit.    Review of Systems  Constitutional:  Negative for chills, fever, malaise/fatigue and weight loss.  HENT:  Negative for congestion, ear pain, hearing loss, sinus pain and sore throat.   Eyes:  Negative for blurred vision, photophobia and pain.  Respiratory:  Positive for cough. Negative for shortness of breath and wheezing.   Cardiovascular:  Negative for chest pain, palpitations and leg swelling.  Gastrointestinal:  Negative for abdominal pain, constipation, diarrhea, heartburn, nausea and vomiting.  Genitourinary:  Negative for dysuria, frequency and urgency.  Musculoskeletal:  Negative for falls and neck pain.  Skin:  Negative for itching and rash.  Neurological:  Negative for dizziness, weakness and headaches.  Endo/Heme/Allergies:  Negative for polydipsia. Does not bruise/bleed easily.  Psychiatric/Behavioral:  Negative for depression, substance abuse and suicidal ideas. The patient is not nervous/anxious.      Objective:     BP 118/79 (BP Location: Right Arm, Cuff Size: Large)   Pulse 96   Resp 20   Ht 5\' 10"  (1.778  m)   Wt 258 lb 3.2 oz (117.1 kg)   SpO2 98%   BMI 37.05 kg/m    Physical Exam Constitutional:      General: He is not in acute distress.    Appearance: Normal appearance. He is obese. He is not ill-appearing.  HENT:     Head: Normocephalic and atraumatic.     Right Ear: Tympanic membrane, ear canal and external ear normal. There is no impacted cerumen.     Left Ear: Tympanic membrane, ear canal and external ear normal. There is no impacted cerumen.     Nose: Nose normal. No congestion or rhinorrhea.     Mouth/Throat:      Mouth: Mucous membranes are moist.     Pharynx: No oropharyngeal exudate or posterior oropharyngeal erythema.  Eyes:     General: No scleral icterus.       Right eye: No discharge.        Left eye: No discharge.     Extraocular Movements: Extraocular movements intact.     Conjunctiva/sclera: Conjunctivae normal.     Pupils: Pupils are equal, round, and reactive to light.  Neck:     Thyroid: No thyromegaly.     Vascular: No carotid bruit or JVD.     Trachea: Trachea normal.  Cardiovascular:     Rate and Rhythm: Normal rate and regular rhythm.     Pulses: Normal pulses.     Heart sounds: Normal heart sounds. No murmur heard.    No friction rub. No gallop.  Pulmonary:     Effort: Pulmonary effort is normal. No respiratory distress.     Breath sounds: Normal breath sounds. No wheezing.  Abdominal:     General: Bowel sounds are normal. There is no distension.     Palpations: Abdomen is soft.     Tenderness: There is no abdominal tenderness. There is no guarding.  Musculoskeletal:        General: Normal range of motion.     Cervical back: Normal range of motion and neck supple.  Lymphadenopathy:     Cervical: No cervical adenopathy.  Skin:    General: Skin is warm and dry.  Neurological:     Mental Status: He is alert and oriented to person, place, and time.     Cranial Nerves: No cranial nerve deficit.  Psychiatric:        Mood and Affect: Mood normal.        Behavior: Behavior normal.        Thought Content: Thought content normal.        Judgment: Judgment normal.   No results found for any visits on 04/13/22.     Assessment & Plan:    Routine Health Maintenance and Physical Exam  Immunization History  Administered Date(s) Administered   Hepatitis B 05/05/1996, 06/08/1996, 07/28/1998   Influenza, Seasonal, Injecte, Preservative Fre 12/13/2018   Influenza,inj,Quad PF,6-35 Mos 10/28/2019   Influenza-Unspecified 11/07/2020   MMR 05/08/1986, 01/04/1987   PFIZER(Purple  Top)SARS-COV-2 Vaccination 01/20/2019, 02/08/2019, 10/28/2019   Tdap 04/10/2013, 08/30/2014    Health Maintenance  Topic Date Due   INFLUENZA VACCINE  04/29/2022 (Originally 08/29/2021)   COVID-19 Vaccine (4 - 2023-24 season) 04/29/2022 (Originally 09/29/2021)   DTaP/Tdap/Td (3 - Td or Tdap) 08/29/2024   HPV VACCINES  Aged Out   Hepatitis C Screening  Discontinued   HIV Screening  Discontinued    Discussed health benefits of physical activity, and encouraged him to engage in regular exercise appropriate for  his age and condition.  1. Annual physical exam Checking labs as below.  Up-to-date on preventative care.  Wellness information provided with AVS. - CMP14+EGFR - CBC with Differential/Platelet - Lipid panel  2. Lipid screening Checking lipids today. - Lipid panel  3. Thyroid disorder screen Checking TSH today. - TSH  4. Diabetes mellitus screening Checking A1c today. - Hemoglobin A1c  5. 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  Suspect allergies may be exacerbating things.  Recommend continuing Zyrtec.  Use albuterol inhaler as needed.  Since he has seen improvement after the short burst of steroids, we will go ahead and do another 5-day burst.  Exam is benign send no concerns for pneumonia or infectious etiology at this time.  Return in about 1 year (around 04/13/2023) for annual physical exam.   Samuel Bouche, NP

## 2022-04-16 ENCOUNTER — Ambulatory Visit: Payer: Commercial Managed Care - PPO | Admitting: Family Medicine

## 2022-04-27 ENCOUNTER — Encounter: Payer: Self-pay | Admitting: Medical-Surgical

## 2022-04-30 ENCOUNTER — Ambulatory Visit: Payer: Commercial Managed Care - PPO | Admitting: Family Medicine

## 2022-05-12 ENCOUNTER — Telehealth: Payer: Commercial Managed Care - PPO | Admitting: Nurse Practitioner

## 2022-05-12 DIAGNOSIS — B9789 Other viral agents as the cause of diseases classified elsewhere: Secondary | ICD-10-CM

## 2022-05-12 MED ORDER — FLUTICASONE PROPIONATE 50 MCG/ACT NA SUSP: 2.0000 | Freq: Every day | NASAL | 6 refills | Status: DC

## 2022-05-12 NOTE — Progress Notes (Signed)
E-Visit for Sinus Problems  We are sorry that you are not feeling well.  Here is how we plan to help!  Based on what you have shared with me it looks like you have sinusitis.  Sinusitis is inflammation and infection in the sinus cavities of the head.  Based on your presentation I believe you most likely have Acute Viral Sinusitis.This is an infection most likely caused by a virus. There is not specific treatment for viral sinusitis other than to help you with the symptoms until the infection runs its course.  You may use an oral decongestant such as Mucinex D or if you have glaucoma or high blood pressure use plain Mucinex. Saline nasal spray help and can safely be used as often as needed for congestion, I have prescribed: Fluticasone nasal spray two sprays in each nostril once a day  Some authorities believe that zinc sprays or the use of Echinacea may shorten the course of your symptoms.  Sinus infections are not as easily transmitted as other respiratory infection, however we still recommend that you avoid close contact with loved ones, especially the very young and elderly.  Remember to wash your hands thoroughly throughout the day as this is the number one way to prevent the spread of infection!  Home Care: Only take medications as instructed by your medical team. Do not take these medications with alcohol. A steam or ultrasonic humidifier can help congestion.  You can place a towel over your head and breathe in the steam from hot water coming from a faucet. Avoid close contacts especially the very young and the elderly. Cover your mouth when you cough or sneeze. Always remember to wash your hands.  Get Help Right Away If: You develop worsening fever or sinus pain. You develop a severe head ache or visual changes. Your symptoms persist after you have completed your treatment plan.  Make sure you Understand these instructions. Will watch your condition. Will get help right away if you  are not doing well or get worse.   Thank you for choosing an e-visit.  Your e-visit answers were reviewed by a board certified advanced clinical practitioner to complete your personal care plan. Depending upon the condition, your plan could have included both over the counter or prescription medications.  Please review your pharmacy choice. Make sure the pharmacy is open so you can pick up prescription now. If there is a problem, you may contact your provider through MyChart messaging and have the prescription routed to another pharmacy.  Your safety is important to us. If you have drug allergies check your prescription carefully.   For the next 24 hours you can use MyChart to ask questions about today's visit, request a non-urgent call back, or ask for a work or school excuse. You will get an email in the next two days asking about your experience. I hope that your e-visit has been valuable and will speed your recovery.  Bryan Ayomide Purdy, FNP   5-10 minutes spent reviewing and documenting in chart.  

## 2022-05-13 ENCOUNTER — Telehealth: Payer: Commercial Managed Care - PPO

## 2022-05-16 ENCOUNTER — Ambulatory Visit: Payer: Commercial Managed Care - PPO

## 2022-05-16 ENCOUNTER — Telehealth: Payer: Commercial Managed Care - PPO | Admitting: Nurse Practitioner

## 2022-05-16 DIAGNOSIS — J329 Chronic sinusitis, unspecified: Secondary | ICD-10-CM

## 2022-05-16 NOTE — Progress Notes (Signed)
Bryan Jackson, In reviewing your chart is appears we have treated you for 5-6 sinus infections in the past year. Frequently you had residual ear pain most sinusitis.   At this point this is becoming a chronic recurrent condition that appears to be getting more severe with each episode and E-visits is no longer the appropriate mode of care.   The best option would be to see your primary care where you could discuss a possible referral to an ENT if you have not seen one in the past   If you have seen an ENT I would schedule a follow up with them   If you cannot be seen urgently at primary care I would recommend an Urgent Care visit where referrals can be placed.   All the best     NOTE: There will be NO CHARGE for this eVisit   If you are having a true medical emergency please call 911.      For an urgent face to face visit, [name] has eight urgent care centers for your convenience:   NEW!! [facility] Health Urgent Care Center at [facility] Get Driving Directions [phone] [address], [address] [place], [address]    [facility] Health Urgent Care Center at [facility] Get Driving Directions [phone] [address] [place], [place] [address]   [facility] Health Urgent Care Center [facility]) Get Driving Directions [phone] [address] [place], [place] [address]  [facility] Health Urgent Care Center [facility]) Get Driving Directions [phone] [address] [place],  [place]  [address]  [facility] Health Urgent Care Center [facility] - at [employer]  [phone] [address] [place],  [place] [address]   [facility] Health Urgent Care at [facility] Get Driving Directions [phone] [address] [place], [place] [address]   [facility] Health Urgent Care at [facility] Get Driving Directions  [phone] [address] [place], [place] [address]    [facility] Health Urgent Care at [facility] Directions [phone] [address] [place], [place] [address]  Your MyChart E-visit questionnaire answers were reviewed by a board certified advanced clinical practitioner to complete your personal care plan based on your specific symptoms.  Thank you for using e-Visits.

## 2022-05-17 ENCOUNTER — Encounter: Payer: Self-pay | Admitting: Medical-Surgical

## 2022-05-17 ENCOUNTER — Ambulatory Visit: Payer: Commercial Managed Care - PPO | Admitting: Medical-Surgical

## 2022-05-17 VITALS — BP 122/84 | HR 85 | Resp 20 | Ht 70.0 in | Wt 258.4 lb

## 2022-05-17 DIAGNOSIS — J329 Chronic sinusitis, unspecified: Secondary | ICD-10-CM | POA: Diagnosis not present

## 2022-05-17 MED ORDER — PREDNISONE 5 MG (21) PO TBPK: ORAL_TABLET | ORAL | 0 refills | Status: DC

## 2022-05-17 MED ORDER — MOXIFLOXACIN HCL 400 MG PO TABS: 400.0000 mg | ORAL_TABLET | Freq: Every day | ORAL | 0 refills | Status: AC

## 2022-05-17 NOTE — Progress Notes (Signed)
        Established patient visit  History, exam, impression, and plan:  1. Chronic sinusitis, unspecified location Pleasant 37 year old male presenting today with recurrent issues including chronic sinusitis.  Has had 5-6 episodes over the last year that required treatment with antibiotics and/or steroids.  Unfortunately, his symptoms returned approximately 5 to 6 days ago including severe sinus pressure, facial pressure, left ear pain, cervical lymphadenopathy, and thick green nasal discharge.  Has been taking an oral antihistamine and using Flonase regularly.  Had some left over prednisone from his last prescription and ended up taking a half dose of this yesterday.  Reports that this helped tremendously although his symptoms are still moderate to severe.  No fevers, chest pain, shortness of breath, or cough.  Has done a couple of ED visits in the last week.  The first 1 diagnosed with viral sinusitis and recommended conservative measures.  The second visit was completed and he was advised to see his PCP for in person evaluation and possible referral to ENT.  On exam today, bilateral mild tenderness across the forehead, purulent nasal discharge, left TM erythema.  Lungs CTA.  Respirations even unlabored.  HRR, S1/S2 normal.  With obvious purulent nasal discharge, we will going treat with antibiotics and a taper of steroids.  He has taken Augmentin as well as doxycycline which provided minimal to moderate benefit however his symptoms always return.  He would like to try something different today so sending in Avelox 400 mg daily x 10 days.  Adding 6-day prednisone taper.  Referring to ENT for further evaluation.  Discussed imaging with CT maxillofacial however he would like to hold off on that to see what ENT recommends.  Continue oral antihistamine and Flonase as prescribed. - Ambulatory referral to ENT  Procedures performed this visit: None.  Return if symptoms worsen or fail to  improve.  __________________________________ Bryan Ohm, DNP, APRN, FNP-BC Primary Care and Sports Medicine [facility]

## 2022-05-28 ENCOUNTER — Encounter: Payer: Self-pay | Admitting: Medical-Surgical

## 2022-06-01 ENCOUNTER — Ambulatory Visit: Payer: Commercial Managed Care - PPO | Admitting: Medical-Surgical

## 2022-09-28 ENCOUNTER — Encounter: Payer: Self-pay | Admitting: Medical-Surgical

## 2022-10-08 ENCOUNTER — Ambulatory Visit
Admission: RE | Admit: 2022-10-08 | Discharge: 2022-10-08 | Disposition: A | Discharge status: Home / Self Care | Payer: Commercial Managed Care - PPO | Source: Ambulatory Visit | Attending: Family Medicine | Admitting: Family Medicine

## 2022-10-08 ENCOUNTER — Other Ambulatory Visit: Payer: Self-pay

## 2022-10-08 ENCOUNTER — Telehealth: Payer: Self-pay | Admitting: Family Medicine

## 2022-10-08 ENCOUNTER — Other Ambulatory Visit (HOSPITAL_BASED_OUTPATIENT_CLINIC_OR_DEPARTMENT_OTHER): Payer: Self-pay

## 2022-10-08 VITALS — BP 131/81 | HR 84 | Temp 98.2°F | Resp 16

## 2022-10-08 DIAGNOSIS — U071 COVID-19: Secondary | ICD-10-CM

## 2022-10-08 DIAGNOSIS — H6692 Otitis media, unspecified, left ear: Secondary | ICD-10-CM | POA: Diagnosis not present

## 2022-10-08 MED ORDER — PREDNISONE 50 MG PO TABS: ORAL_TABLET | ORAL | 0 refills | Status: DC

## 2022-10-08 MED ORDER — AMOXICILLIN-POT CLAVULANATE 875-125 MG PO TABS
1.0000 | ORAL_TABLET | Freq: Two times a day (BID) | ORAL | 0 refills | Status: DC
  Filled 2022-10-08 (×2): qty 14, 7d supply, fill #0

## 2022-10-08 MED ORDER — PREDNISONE 50 MG PO TABS
50.0000 mg | ORAL_TABLET | Freq: Every day | ORAL | 0 refills | Status: DC
  Filled 2022-10-08: qty 5, 5d supply, fill #0

## 2022-10-08 MED ORDER — AMOXICILLIN-POT CLAVULANATE 875-125 MG PO TABS: 1.0000 | ORAL_TABLET | Freq: Two times a day (BID) | ORAL | 0 refills | Status: DC

## 2022-10-08 NOTE — ED Triage Notes (Signed)
Left ear fullness since last thursday

## 2022-10-08 NOTE — ED Provider Notes (Signed)
Bryan Jackson [facility]    CSN: [id number] Arrival date & time: 10/08/22  0917      History   Chief Complaint Chief Complaint  Patient presents with   Ear Fullness    Covid positive 14  days ago. Symptoms improved last Monday. Started having sinus issues land post nasal drip last Thursday. Ear fullness and ear pain in left ear started on Friday. - Entered by patient    HPI Bryan Jackson is a 37 y.o. male.   Patient had COVID over a week ago.  Sinus pressure in his maxillary sinuses, and some left ear pain and pressure.  Also continues to cough.  He has concern regarding pain and muffled hearing on the left.    Past Medical History:  Diagnosis Date   No pertinent past medical history     Patient Active Problem List   Diagnosis Date Noted   Environmental and seasonal allergies 12/27/2021   Eustachian tube dysfunction, bilateral 12/27/2021    Past Surgical History:  Procedure Laterality Date   NO PAST SURGERIES         Home Medications    Prior to Admission medications   Medication Sig Start Date End Date Taking? Authorizing Provider  amoxicillin-clavulanate (AUGMENTIN) 875-125 MG tablet Take 1 tablet by mouth every 12 (twelve) hours. 10/08/22  Yes Eustace Moore, MD  predniSONE (DELTASONE) 50 MG tablet Take once a day for 5 days.  Take with food 10/08/22  Yes Eustace Moore, MD  albuterol (VENTOLIN HFA) 108 (90 Base) MCG/ACT inhaler Inhale 1-2 puffs into the lungs every 4 (four) hours as needed for wheezing or shortness of breath. 01/03/22   Margaretann Loveless, PA-C    Family History Family History  Problem Relation Age of Onset   Hypertension Mother    Hypertension Father    Hypertension Maternal Grandmother    Heart failure Maternal Grandmother    Hypertension Maternal Grandfather    Heart attack Neg Hx    Stroke Neg Hx     Social History Social History   Tobacco Use   Smoking status: Never   Smokeless tobacco: Never  Vaping Use   Vaping  status: Never Used  Substance Use Topics   Alcohol use: Yes    Comment: rarely   Drug use: Never     Allergies   Doxycycline   Review of Systems Review of Systems See HPI  Physical Exam Triage Vital Signs ED Triage Vitals [10/08/22 0924]  Encounter Vitals Group     BP 131/81     Systolic BP Percentile      Diastolic BP Percentile      Pulse Rate 84     Resp 16     Temp 98.2 F (36.8 C)     Temp Source Oral     SpO2 99 %     Weight      Height      Head Circumference      Peak Flow      Pain Score 5     Pain Loc      Pain Education      Exclude from Growth Chart    No data found.  Updated Vital Signs BP 131/81 (BP Location: Left Arm)   Pulse 84   Temp 98.2 F (36.8 C) (Oral)   Resp 16   SpO2 99%       Physical Exam Constitutional:      General: He is not in acute distress.  Appearance: He is well-developed. He is obese. He is ill-appearing.  HENT:     Head: Normocephalic and atraumatic.     Right Ear: Tympanic membrane and ear canal normal.     Left Ear: Ear canal normal.     Ears:     Comments: Left TM has injection, dull appearance    Nose: Congestion and rhinorrhea present.     Comments: Mild tenderness maxillary sinuses    Mouth/Throat:     Mouth: Mucous membranes are moist.     Pharynx: No posterior oropharyngeal erythema.  Eyes:     Conjunctiva/sclera: Conjunctivae normal.     Pupils: Pupils are equal, round, and reactive to light.  Cardiovascular:     Rate and Rhythm: Normal rate and regular rhythm.     Heart sounds: Normal heart sounds.  Pulmonary:     Effort: Pulmonary effort is normal. No respiratory distress.     Breath sounds: Normal breath sounds.  Abdominal:     General: There is no distension.     Palpations: Abdomen is soft.  Musculoskeletal:        General: Normal range of motion.     Cervical back: Normal range of motion.  Lymphadenopathy:     Cervical: No cervical adenopathy.  Skin:    General: Skin is warm and dry.   Neurological:     Mental Status: He is alert.      UC Treatments / Results  Labs (all labs ordered are listed, but only abnormal results are displayed) Labs Reviewed - No data to display  EKG   Radiology No results found.  Procedures Procedures (including critical care time)  Medications Ordered in UC Medications - No data to display  Initial Impression / Assessment and Plan / UC Course  I have reviewed the triage vital signs and the nursing notes.  Pertinent labs & imaging results that were available during my care of the patient were reviewed by me and considered in my medical decision making (see chart for details).      Final Clinical Impressions(s) / UC Diagnoses   Final diagnoses:  Left otitis media, unspecified otitis media type  COVID-19     Discharge Instructions      Drink lots of water Take the Augmentin 2 times a day for 7 days Prednisone daily for 5 days Call for problems    ED Prescriptions     Medication Sig Dispense Auth. Provider   amoxicillin-clavulanate (AUGMENTIN) 875-125 MG tablet Take 1 tablet by mouth every 12 (twelve) hours. 14 tablet Eustace Moore, MD   predniSONE (DELTASONE) 50 MG tablet Take once a day for 5 days.  Take with food 5 tablet Eustace Moore, MD      PDMP not reviewed this encounter.   Eustace Moore, MD 10/08/22 [phone]

## 2022-10-08 NOTE — Discharge Instructions (Signed)
Drink lots of water Take the Augmentin 2 times a day for 7 days Prednisone daily for 5 days Call for problems

## 2022-10-08 NOTE — Telephone Encounter (Signed)
Resent Rx 

## 2022-10-09 ENCOUNTER — Ambulatory Visit (INDEPENDENT_AMBULATORY_CARE_PROVIDER_SITE_OTHER): Payer: Commercial Managed Care - PPO | Admitting: Medical-Surgical

## 2022-10-09 DIAGNOSIS — Z91199 Patient's noncompliance with other medical treatment and regimen due to unspecified reason: Secondary | ICD-10-CM

## 2022-10-09 NOTE — Progress Notes (Unsigned)
   Complete physical exam  [name]   DOB: 11/18/1998   37 y.o. Male  MRN: [id number]  Subjective:    No chief complaint on file.   Bryan Jackson is a 37 y.o. male who presents today for a complete physical exam. She reports consuming a {diet types:17450} diet. {types:19826} She generally feels {DESC; WELL/FAIRLY WELL/POORLY:18703}. She reports sleeping {DESC; WELL/FAIRLY WELL/POORLY:18703}. She {does/does not:200015} have additional problems to discuss today.    Most recent fall risk assessment:    07/26/2021   10:42 AM  Fall Risk   Falls in the past year? 0  Number falls in past yr: 0  Injury with Fall? 0  Risk for fall due to : No Fall Risks  Follow up Falls evaluation completed     Most recent depression screenings:    07/26/2021   10:42 AM 06/16/2020   10:46 AM  PHQ 2/9 Scores  PHQ - 2 Score 0 0  PHQ- 9 Score 5     {VISON DENTAL STD PSA (Optional):27386}  {History (Optional):23778}  Patient Care Team: Jessup, Joy, NP as PCP - General (Nurse Practitioner)   Outpatient Medications Prior to Visit  Medication Sig   fluticasone (FLONASE) 50 MCG/ACT nasal spray Place 2 sprays into both nostrils in the morning and at bedtime. After 7 days, reduce to once daily.   norgestimate-ethinyl estradiol (SPRINTEC 28) 0.25-35 MG-MCG tablet Take 1 tablet by mouth daily.   Nystatin POWD Apply liberally to affected area 2 times per day   spironolactone (ALDACTONE) 100 MG tablet Take 1 tablet (100 mg total) by mouth daily.   No facility-administered medications prior to visit.    ROS        Objective:     There were no vitals taken for this visit. {Vitals History (Optional):23777}  Physical Exam   No results found for any visits on 08/31/21. {Show previous labs (optional):23779}    Assessment & Plan:    Routine Health Maintenance and Physical Exam  Immunization History  Administered Date(s) Administered   DTaP 02/01/1999, 03/30/1999,  06/08/1999, 02/22/2000, 09/07/2003   Hepatitis A 07/04/2007, 07/09/2008   Hepatitis B 11/19/1998, 12/27/1998, 06/08/1999   HiB (PRP-OMP) 02/01/1999, 03/30/1999, 06/08/1999, 02/22/2000   IPV 02/01/1999, 03/30/1999, 11/27/1999, 09/07/2003   Influenza,inj,Quad PF,6+ Mos 10/09/2013   Influenza-Unspecified 01/09/2012   MMR 11/26/2000, 09/07/2003   Meningococcal Polysaccharide 07/09/2011   Pneumococcal Conjugate-13 02/22/2000   Pneumococcal-Unspecified 06/08/1999, 08/22/1999   Tdap 07/09/2011   Varicella 11/27/1999, 07/04/2007    Health Maintenance  Topic Date Due   HIV Screening  Never done   Hepatitis C Screening  Never done   INFLUENZA VACCINE  08/29/2021   PAP-Cervical Cytology Screening  08/31/2021 (Originally 11/18/2019)   PAP SMEAR-Modifier  08/31/2021 (Originally 11/18/2019)   TETANUS/TDAP  08/31/2021 (Originally 07/08/2021)   HPV VACCINES  Discontinued   COVID-19 Vaccine  Discontinued    Discussed health benefits of physical activity, and encouraged her to engage in regular exercise appropriate for her age and condition.  Problem List Items Addressed This Visit   None Visit Diagnoses     Annual physical exam    -  Primary   Cervical cancer screening       Need for Tdap vaccination          No follow-ups on file.     Joy Jessup, NP   

## 2022-10-15 ENCOUNTER — Encounter: Payer: Self-pay | Admitting: Medical-Surgical

## 2022-12-01 ENCOUNTER — Encounter: Payer: Self-pay | Admitting: Medical-Surgical

## 2022-12-01 DIAGNOSIS — G4733 Obstructive sleep apnea (adult) (pediatric): Secondary | ICD-10-CM

## 2022-12-04 MED ORDER — AMBULATORY NON FORMULARY MEDICATION
0 refills | Status: DC
Start: 2022-12-04 — End: 2022-12-07

## 2022-12-04 NOTE — Addendum Note (Signed)
Addended byChristen Butter on: 12/04/2022 05:26 PM   Modules accepted: Orders

## 2022-12-07 ENCOUNTER — Other Ambulatory Visit: Payer: Self-pay

## 2022-12-07 DIAGNOSIS — G4733 Obstructive sleep apnea (adult) (pediatric): Secondary | ICD-10-CM

## 2022-12-07 MED ORDER — AMBULATORY NON FORMULARY MEDICATION: 0 refills | Status: AC

## 2022-12-08 ENCOUNTER — Telehealth: Payer: Commercial Managed Care - PPO | Admitting: Physician Assistant

## 2022-12-08 DIAGNOSIS — J069 Acute upper respiratory infection, unspecified: Secondary | ICD-10-CM

## 2022-12-08 NOTE — Progress Notes (Signed)
I have spent 5 minutes in review of e-visit questionnaire, review and updating patient chart, medical decision making and response to patient.   Laure Kidney, PA-C

## 2022-12-08 NOTE — Progress Notes (Signed)
Because of your symptoms, I feel your condition warrants further evaluation and I recommend that you be seen in a face to face visit.   NOTE: There will be NO CHARGE for this eVisit   If you are having a true medical emergency please call 911.      For an urgent face to face visit, [facility] has eight urgent care centers for your convenience:   NEW!! [facility] at [facility] Get Driving Directions [phone] [address], [address] [place], [address]    [name] Health [facility] Care Center at [facility] Get Driving Directions [phone] [address] [place], [place] [address]   [facility] Health Urgent Care Center [facility]) Get Driving Directions [phone] [address] [place], [place] [address]  [facility]gent [facility] Center [facility]) Get Driving Directions [phone] [address] [place],  [place]  [address]  [facility] Health Urgent Care Center [facility] - at [employer]  [phone] [address] [place],  [place] [address]   [facility] Health Urgent Care at [facility] Get Driving Directions [phone] [address], [address] [place], [place] [address]   [facility] Health Urgent Care at [facility] Get Driving Directions  [phone] [address] [place], [place] [address]   [facility] Health Urgent Care at [facility] Directions [phone] [address], [address] [place], [place] [address]  Your MyChart E-visit questionnaire answers were reviewed by a board certified advanced clinical practitioner to complete your personal care plan based on your specific symptoms.  Thank you for using e-Visits.

## 2022-12-09 ENCOUNTER — Other Ambulatory Visit: Payer: Self-pay

## 2022-12-09 ENCOUNTER — Ambulatory Visit
Admission: EM | Admit: 2022-12-09 | Discharge: 2022-12-09 | Disposition: A | Discharge status: Home / Self Care | Payer: Commercial Managed Care - PPO | Attending: Family Medicine | Admitting: Family Medicine

## 2022-12-09 DIAGNOSIS — H6692 Otitis media, unspecified, left ear: Secondary | ICD-10-CM | POA: Diagnosis not present

## 2022-12-09 DIAGNOSIS — H9202 Otalgia, left ear: Secondary | ICD-10-CM

## 2022-12-09 MED ORDER — AMOXICILLIN-POT CLAVULANATE 875-125 MG PO TABS: 1.0000 | ORAL_TABLET | Freq: Two times a day (BID) | ORAL | 1 refills | Status: DC

## 2022-12-09 NOTE — Discharge Instructions (Signed)
Continue your nasal steroids, nasal saline Drink lots of water Take the Augmentin 2 times a day for 10 days Consider a probiotic while on Augmentin to protect your stomach The Augmentin has a refill in case it is needed

## 2022-12-09 NOTE — ED Triage Notes (Signed)
Has c/o left ear pain since Thursday, no fever

## 2022-12-09 NOTE — ED Provider Notes (Signed)
[name]    CSN: [id number] Arrival date & time: 12/09/22  0830      History   Chief Complaint Chief Complaint  Patient presents with   Otalgia    HPI Bryan Jackson is a 37 y.o. male.   HPI Patient is known to me from prior visits.  He has a 16-year-old daughter and has been having recurring upper respiratory infections.  With this she has had recurrent left ear pain and left otitis.  He plans to ask his primary care doctor for referral to ENT.  He is here now because he has had a cold for about a week, and now has left ear pain.  Decreased hearing.  Past Medical History:  Diagnosis Date   No pertinent past medical history     Patient Active Problem List   Diagnosis Date Noted   Environmental and seasonal allergies 12/27/2021   Eustachian tube dysfunction, bilateral 12/27/2021    Past Surgical History:  Procedure Laterality Date   NO PAST SURGERIES         Home Medications    Prior to Admission medications   Medication Sig Start Date End Date Taking? Authorizing Provider  cetirizine (ZYRTEC) 10 MG tablet Take 10 mg by mouth daily.   Yes [provider]  albuterol (VENTOLIN HFA) 108 (90 Base) MCG/ACT inhaler Inhale 1-2 puffs into the lungs every 4 (four) hours as needed for wheezing or shortness of breath. 01/03/22   Margaretann Loveless, PA-C  AMBULATORY NON FORMULARY MEDICATION Continuous positive airway pressure (CPAP) machine set on AutoPAP (4-20 cmH2O), with all supplemental supplies as needed. 12/07/22   Christen Butter, NP  amoxicillin-clavulanate (AUGMENTIN) 875-125 MG tablet Take 1 tablet by mouth every 12 (twelve) hours. 12/09/22   Eustace Moore, MD    Family History Family History  Problem Relation Age of Onset   Hypertension Mother    Hypertension Father    Hypertension Maternal Grandmother    Heart failure Maternal Grandmother    Hypertension Maternal Grandfather    Heart attack Neg Hx    Stroke Neg Hx     Social  History Social History   Tobacco Use   Smoking status: Never   Smokeless tobacco: Never  Vaping Use   Vaping status: Never Used  Substance Use Topics   Alcohol use: Yes    Comment: rarely   Drug use: Never     Allergies   Doxycycline   Review of Systems Review of Systems  See HPI Physical Exam Triage Vital Signs ED Triage Vitals  Encounter Vitals Group     BP 12/09/22 0836 133/85     Systolic BP Percentile --      Diastolic BP Percentile --      Pulse Rate 12/09/22 0836 84     Resp 12/09/22 0836 16     Temp 12/09/22 0836 98.5 F (36.9 C)     Temp Source 12/09/22 0836 Oral     SpO2 12/09/22 0836 98 %     Weight --      Height --      Head Circumference --      Peak Flow --      Pain Score 12/09/22 0839 8     Pain Loc --      Pain Education --      Exclude from Growth Chart --    No data found.  Updated Vital Signs BP 133/85   Pulse 84   Temp 98.5 F (  36.9 C) (Oral)   Resp 16   SpO2 98%      Physical Exam Constitutional:      General: He is not in acute distress.    Appearance: He is well-developed. He is not ill-appearing.     Comments: Pleasant.  Overweight.  HENT:     Head: Normocephalic and atraumatic.     Right Ear: Tympanic membrane, ear canal and external ear normal.     Left Ear: Ear canal and external ear normal.     Ears:     Comments: Left TM is dull.  Injection peripherally.    Nose: Congestion present.     Mouth/Throat:     Pharynx: No posterior oropharyngeal erythema.  Eyes:     Conjunctiva/sclera: Conjunctivae normal.     Pupils: Pupils are equal, round, and reactive to light.  Cardiovascular:     Rate and Rhythm: Normal rate.  Pulmonary:     Effort: Pulmonary effort is normal. No respiratory distress.  Musculoskeletal:        General: Normal range of motion.     Cervical back: Normal range of motion.  Lymphadenopathy:     Cervical: No cervical adenopathy.  Skin:    General: Skin is warm and dry.  Neurological:      Mental Status: He is alert.      UC Treatments / Results  Labs (all labs ordered are listed, but only abnormal results are displayed) Labs Reviewed - No data to display  EKG   Radiology No results found.  Procedures Procedures (including critical care time)  Medications Ordered in UC Medications - No data to display  Initial Impression / Assessment and Plan / UC Course  I have reviewed the triage vital signs and the nursing notes.  Pertinent labs & imaging results that were available during my care of the patient were reviewed by me and considered in my medical decision making (see chart for details).     Reviewing patient a refill of his Augmentin because he has had recurring ear infections.  His ear is quite painful.  He is an [employer] and knows appropriate symptoms for taking the antibiotic.  I feel he may need another course Banex prior to seeing the ENT.  He is told to call his PCP for instructions if he does need the antibiotics. Final Clinical Impressions(s) / UC Diagnoses   Final diagnoses:  Recurrent acute otitis media of left ear  Acute otalgia, left     Discharge Instructions      Continue your nasal steroids, nasal saline Drink lots of water Take the Augmentin 2 times a day for 10 days Consider a probiotic while on Augmentin to protect your stomach The Augmentin has a refill in case it is needed   ED Prescriptions     Medication Sig Dispense Auth. Provider   amoxicillin-clavulanate (AUGMENTIN) 875-125 MG tablet Take 1 tablet by mouth every 12 (twelve) hours. 20 tablet Eustace Moore, MD      PDMP not reviewed this encounter.   Eustace Moore, MD 12/09/22 [phone]

## 2022-12-11 DIAGNOSIS — Z452 Encounter for adjustment and management of vascular access device: Secondary | ICD-10-CM | POA: Diagnosis not present

## 2022-12-13 ENCOUNTER — Other Ambulatory Visit: Payer: Self-pay | Admitting: Medical-Surgical

## 2022-12-13 DIAGNOSIS — U071 COVID-19: Secondary | ICD-10-CM

## 2022-12-13 MED ORDER — ALBUTEROL SULFATE HFA 108 (90 BASE) MCG/ACT IN AERS: 1.0000 | INHALATION_SPRAY | RESPIRATORY_TRACT | 0 refills | Status: DC | PRN

## 2022-12-13 NOTE — Telephone Encounter (Signed)
Printed CPAP Rx and faxed with sleep study and insurance card

## 2022-12-16 DIAGNOSIS — Z0110 Encounter for examination of ears and hearing without abnormal findings: Secondary | ICD-10-CM | POA: Diagnosis not present

## 2022-12-24 DIAGNOSIS — I7389 Other specified peripheral vascular diseases: Secondary | ICD-10-CM | POA: Diagnosis not present

## 2022-12-24 DIAGNOSIS — I471 Supraventricular tachycardia, unspecified: Secondary | ICD-10-CM | POA: Diagnosis not present

## 2023-01-02 ENCOUNTER — Ambulatory Visit: Payer: Commercial Managed Care - PPO | Admitting: Family Medicine

## 2023-01-02 ENCOUNTER — Other Ambulatory Visit (HOSPITAL_BASED_OUTPATIENT_CLINIC_OR_DEPARTMENT_OTHER): Payer: Self-pay

## 2023-01-02 ENCOUNTER — Encounter: Payer: Self-pay | Admitting: Family Medicine

## 2023-01-02 VITALS — BP 130/77 | HR 93 | Temp 97.7°F | Ht 70.0 in | Wt 277.0 lb

## 2023-01-02 DIAGNOSIS — J329 Chronic sinusitis, unspecified: Secondary | ICD-10-CM | POA: Diagnosis not present

## 2023-01-02 MED ORDER — PREDNISONE 20 MG PO TABS
40.0000 mg | ORAL_TABLET | Freq: Every day | ORAL | 0 refills | Status: DC
  Filled 2023-01-02: qty 10, 5d supply, fill #0

## 2023-01-02 NOTE — Progress Notes (Signed)
Acute Office Visit  Subjective:     Patient ID: Bryan Jackson, male    DOB: Apr 08, 1985, 37 y.o.   MRN: [id number]  Chief Complaint  Patient presents with   Otitis Media    HPI Patient is in today for bilateral ear pain, worse on Left.  He was seen initially on November 9 sorry for a viral URI with cough.  He did not e-visit.  He then went to urgent care here in our building the next day and was diagnosed with otitis media of the left ear.  He also had congestion and cold-like symptoms at that time as well.  Given prescription for Augmentin to take twice a day for 10 days.  He says he is had problems with his sinuses on and off for years even 6 years ago he saw ENT and says that they put him on a low-dose antibiotic through most of the wintertime but they had considered doing some surgical procedure at that time but he ended up getting better he said he did pretty good overall until about 2 years ago when he got COVID and then it really ramped up recurrent sinus infections for him and then got better over last summer and now has been started back up again.  ROS      Objective:    BP 130/77   Pulse 93   Temp 97.7 F (36.5 C)   Ht 5\' 10"  (1.778 m)   Wt 277 lb (125.6 kg)   SpO2 100%   BMI 39.75 kg/m    Physical Exam Constitutional:      Appearance: Normal appearance.  HENT:     Head: Normocephalic and atraumatic.     Right Ear: Tympanic membrane, ear canal and external ear normal. There is no impacted cerumen.     Left Ear: Tympanic membrane, ear canal and external ear normal. There is no impacted cerumen.     Nose: Nose normal.     Mouth/Throat:     Pharynx: Oropharynx is clear.  Eyes:     Conjunctiva/sclera: Conjunctivae normal.  Cardiovascular:     Rate and Rhythm: Normal rate and regular rhythm.  Pulmonary:     Effort: Pulmonary effort is normal.     Breath sounds: Normal breath sounds.  Musculoskeletal:     Cervical back: Neck supple. No tenderness.   Lymphadenopathy:     Cervical: No cervical adenopathy.  Skin:    General: Skin is warm and dry.  Neurological:     Mental Status: He is alert and oriented to person, place, and time.  Psychiatric:        Mood and Affect: Mood normal.     No results found for any visits on 01/02/23.      Assessment & Plan:   Problem List Items Addressed This Visit   None Visit Diagnoses     Recurrent sinus infections    -  Primary   Relevant Medications   predniSONE (DELTASONE) 20 MG tablet   Other Relevant Orders   Ambulatory referral to ENT   Sedimentation rate   C-reactive protein   ANCA Titers ( [facility])   CT SINUS WO CONTRAST   IgG, IgA, IgM       Today his ears look normal.  No concerning findings just encouraged him to finish out his current regimen of Augmentin.  Since he still having some persistent sinus pressure fullness and fluidlike feeling we discussed going ahead and getting a sinus  CT.  Could consider treating him for more chronic sinusitis versus acute sinusitis.  Will get that scheduled.  Also recommend referral to ENT for further evaluation.  We did add some prednisone today to see if this provides some relief.  Will check ANCA titers as well as antibody levels.   Meds ordered this encounter  Medications   predniSONE (DELTASONE) 20 MG tablet    Sig: Take 2 tablets (40 mg total) by mouth daily with breakfast.    Dispense:  10 tablet    Refill:  0    No follow-ups on file.  Nani Gasser, MD

## 2023-01-03 LAB — ANCA TITERS
Atypical pANCA: <1:20 {titer}
C-ANCA: <1:20 {titer}
P-ANCA: <1:20 {titer}

## 2023-01-03 LAB — IGG, IGA, IGM
IgA/Immunoglobulin A, Serum: 120 mg/dL (ref 90–386)
IgG (Immunoglobin G), Serum: 1322 mg/dL (ref 603–1613)
IgM (Immunoglobulin M), Srm: 67 mg/dL (ref 20–172)

## 2023-01-03 LAB — SEDIMENTATION RATE: Sed Rate: 7 mm/h (ref 0–15)

## 2023-01-03 LAB — C-REACTIVE PROTEIN: CRP: 8 mg/L (ref 0–10)

## 2023-01-03 NOTE — Progress Notes (Signed)
Hi Bryan Jackson, inflammatory markers are normal which is very reassuring.  No abnormal ANCA titers which is also very reassuring.  And your antibody levels are all normal send nothing to suggest that your immune system is deficient.

## 2023-01-04 ENCOUNTER — Encounter (INDEPENDENT_AMBULATORY_CARE_PROVIDER_SITE_OTHER): Payer: Self-pay | Admitting: Otolaryngology

## 2023-01-04 ENCOUNTER — Telehealth (HOSPITAL_BASED_OUTPATIENT_CLINIC_OR_DEPARTMENT_OTHER): Payer: Self-pay | Admitting: Family Medicine

## 2023-01-25 ENCOUNTER — Ambulatory Visit: Payer: Commercial Managed Care - PPO

## 2023-01-25 DIAGNOSIS — J329 Chronic sinusitis, unspecified: Secondary | ICD-10-CM

## 2023-01-29 ENCOUNTER — Encounter (INDEPENDENT_AMBULATORY_CARE_PROVIDER_SITE_OTHER): Payer: Self-pay

## 2023-01-29 ENCOUNTER — Ambulatory Visit (INDEPENDENT_AMBULATORY_CARE_PROVIDER_SITE_OTHER): Payer: Commercial Managed Care - PPO

## 2023-01-29 VITALS — BP 153/87 | HR 85 | Ht 71.0 in | Wt 262.0 lb

## 2023-01-29 DIAGNOSIS — J310 Chronic rhinitis: Secondary | ICD-10-CM | POA: Diagnosis not present

## 2023-01-29 DIAGNOSIS — J343 Hypertrophy of nasal turbinates: Secondary | ICD-10-CM

## 2023-01-29 DIAGNOSIS — J3489 Other specified disorders of nose and nasal sinuses: Secondary | ICD-10-CM | POA: Diagnosis not present

## 2023-01-29 DIAGNOSIS — R0981 Nasal congestion: Secondary | ICD-10-CM | POA: Diagnosis not present

## 2023-01-29 DIAGNOSIS — J342 Deviated nasal septum: Secondary | ICD-10-CM | POA: Diagnosis not present

## 2023-01-30 DIAGNOSIS — J342 Deviated nasal septum: Secondary | ICD-10-CM | POA: Insufficient documentation

## 2023-01-30 DIAGNOSIS — J343 Hypertrophy of nasal turbinates: Secondary | ICD-10-CM | POA: Insufficient documentation

## 2023-01-30 DIAGNOSIS — J310 Chronic rhinitis: Secondary | ICD-10-CM | POA: Insufficient documentation

## 2023-01-30 NOTE — Progress Notes (Signed)
 Patient ID: Bryan Jackson, male   DOB: 01-29-1986, 38 y.o.   MRN: [id number]  CC: Chronic nasal obstruction, recurrent sinusitis  HPI:  Bryan Jackson is a 38 y.o. male who presents today complaining of chronic nasal obstruction and recurrent sinusitis.  According to the patient, he has been symptomatic for more than 1 year.  He also complains of frequent facial pressure, postnasal drainage, and clogging sensation in his ears.  He was treated with 21 days of antibiotics over the past 2 months.  He has a history of environmental allergies.  He has been using Zyrtec and Flonase  for several years.  He underwent a sinus CT scan last week.  The CT shows no significant infection.  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  Past Medical History:  Diagnosis Date   No pertinent past medical history     Past Surgical History:  Procedure Laterality Date   NO PAST SURGERIES      Family History  Problem Relation Age of Onset   Hypertension Mother    Hypertension Father    Hypertension Maternal Grandmother    Heart failure Maternal Grandmother    Hypertension Maternal Grandfather    Heart attack Neg Hx    Stroke Neg Hx     Social History:  reports that he has never smoked. He has never used smokeless tobacco. He reports current alcohol use. He reports that he does not use drugs.  Allergies:  Allergies  Allergen Reactions   Doxycycline  Nausea Only    Prior to Admission medications   Medication Sig Start Date End Date Taking? Authorizing Provider  albuterol  (VENTOLIN  HFA) 108 (90 Base) MCG/ACT inhaler Inhale 1-2 puffs into the lungs every 4 (four) hours as needed for wheezing or shortness of breath. 12/13/22  Yes Willo Mini, NP  cetirizine (ZYRTEC) 10 MG tablet Take 10 mg by mouth daily.   Yes [provider]  AMBULATORY NON FORMULARY MEDICATION Continuous positive airway pressure  (CPAP) machine set on AutoPAP (4-20 cmH2O), with all supplemental supplies as needed. Patient not taking: Reported on 01/29/2023 12/07/22   Willo Mini, NP  amoxicillin -clavulanate (AUGMENTIN ) 875-125 MG tablet Take 1 tablet by mouth every 12 (twelve) hours. Patient not taking: Reported on 01/29/2023 12/09/22   Maranda Jamee Jacob, MD  predniSONE  (DELTASONE ) 20 MG tablet Take 2 tablets (40 mg total) by mouth daily with breakfast. Patient not taking: Reported on 01/29/2023 01/02/23   Alvan Dorothyann BIRCH, MD    Blood pressure (!) 153/87, pulse 85, height 5' 11 (1.803 m), weight 262 lb (118.8 kg), SpO2 96%. Exam: General: Communicates without difficulty, well nourished, no acute distress. Head: Normocephalic, no evidence injury, no tenderness, facial buttresses intact without stepoff. Face/sinus: No tenderness to palpation and percussion. Facial movement is normal and symmetric. Eyes: PERRL, EOMI. No scleral icterus, conjunctivae clear. Neuro: CN II exam reveals vision grossly intact.  No nystagmus at any point of gaze. Ears: Auricles well formed without lesions.  Ear canals are intact without mass or lesion.  No erythema or edema is appreciated.  The TMs are intact without fluid. Nose: External evaluation reveals normal support and skin without lesions.  Dorsum is intact.  Anterior rhinoscopy reveals congested mucosa over anterior aspect of inferior turbinates and intact septum.  No purulence noted. Oral:  Oral cavity and oropharynx are intact, symmetric, without erythema or edema.  Mucosa is moist without lesions. Neck: Full range of  motion without pain.  There is no significant lymphadenopathy.  No masses palpable.  Thyroid  bed within normal limits to palpation.  Parotid glands and submandibular glands equal bilaterally without mass.  Trachea is midline. Neuro:  CN 2-12 grossly intact.   Procedure:  Flexible Nasal Endoscopy: Description: Risks, benefits, and alternatives of flexible endoscopy were  explained to the patient.  Specific mention was made of the risk of throat numbness with difficulty swallowing, possible bleeding from the nose and mouth, and pain from the procedure.  The patient gave oral consent to proceed.  The flexible scope was inserted into the right nasal cavity.  Endoscopy of the interior nasal cavity, superior, inferior, and middle meatus was performed. The sphenoid-ethmoid recess was examined. Edematous mucosa was noted.  No polyp, mass, or lesion was appreciated. Nasal septal spur noted. Olfactory cleft was clear.  Nasopharynx was clear.  Turbinates were hypertrophied but without mass.  The procedure was repeated on the contralateral side with similar findings.  The patient tolerated the procedure well.   Assessment: 1.  Chronic rhinitis with nasal mucosal congestion, bilateral inferior turbinate hypertrophy, and nasal septal spurs, causing significant nasal obstruction.  More than 95% of his nasal passageways are obstructed bilaterally. 2.  History of recurrent sinusitis.  However, no acute infection is noted today.  He recently completed 21 days of antibiotics.  Plan: 1.  The physical exam and nasal endoscopy findings are reviewed with the patient.  The CT images are also reviewed. 2.  Continue with Zyrtec and Flonase  daily. 3.  Based on the above findings, the patient will benefit from surgical intervention with septoplasty and bilateral turbinate reduction.  The risk, benefits, and details of the procedures are reviewed.  Questions are invited and answered. 4.  The patient would like to proceed with the procedures.  Mikala Podoll W Kessie Croston 01/30/2023, 10:30 AM

## 2023-02-11 ENCOUNTER — Encounter: Payer: Self-pay | Admitting: Family Medicine

## 2023-03-05 ENCOUNTER — Other Ambulatory Visit (HOSPITAL_BASED_OUTPATIENT_CLINIC_OR_DEPARTMENT_OTHER): Payer: Self-pay

## 2023-03-05 ENCOUNTER — Ambulatory Visit (INDEPENDENT_AMBULATORY_CARE_PROVIDER_SITE_OTHER): Payer: Commercial Managed Care - PPO | Admitting: Medical-Surgical

## 2023-03-05 ENCOUNTER — Encounter: Payer: Self-pay | Admitting: Medical-Surgical

## 2023-03-05 VITALS — BP 124/76 | HR 92 | Resp 20 | Ht 71.0 in | Wt 279.6 lb

## 2023-03-05 DIAGNOSIS — E66812 Obesity, class 2: Secondary | ICD-10-CM

## 2023-03-05 DIAGNOSIS — G4733 Obstructive sleep apnea (adult) (pediatric): Secondary | ICD-10-CM

## 2023-03-05 DIAGNOSIS — J0100 Acute maxillary sinusitis, unspecified: Secondary | ICD-10-CM

## 2023-03-05 DIAGNOSIS — Z13220 Encounter for screening for lipoid disorders: Secondary | ICD-10-CM

## 2023-03-05 DIAGNOSIS — Z0000 Encounter for general adult medical examination without abnormal findings: Secondary | ICD-10-CM | POA: Diagnosis not present

## 2023-03-05 DIAGNOSIS — Z1329 Encounter for screening for other suspected endocrine disorder: Secondary | ICD-10-CM | POA: Insufficient documentation

## 2023-03-05 DIAGNOSIS — Z6836 Body mass index (BMI) 36.0-36.9, adult: Secondary | ICD-10-CM | POA: Diagnosis not present

## 2023-03-05 MED ORDER — AMOXICILLIN-POT CLAVULANATE 875-125 MG PO TABS
1.0000 | ORAL_TABLET | Freq: Two times a day (BID) | ORAL | 0 refills | Status: DC
  Filled 2023-03-05: qty 14, 7d supply, fill #0

## 2023-03-05 NOTE — Progress Notes (Signed)
 Annual Wellness Visit     [name], Male    DOB: [birth year], 38 y.o.   MRN: [id number]  Subjective  Chief Complaint  Patient presents with   Annual Exam    Bryan Jackson is a 38 y.o. male who presents today for his Annual Wellness Visit. He reports consuming a general diet. The patient does not participate in regular exercise at present. He generally feels fairly well. He reports sleeping fairly well. He does have additional problems to discuss today.   HPI Pt presents for annual physical and URI.  Patient Active Problem List   Diagnosis Date Noted   Thyroid  disorder screen 03/05/2023   Chronic rhinitis 01/30/2023   Deviated nasal septum 01/30/2023   Hypertrophy of nasal turbinates 01/30/2023   Environmental and seasonal allergies 12/27/2021   Eustachian tube dysfunction, bilateral 12/27/2021   Past Medical History:  Diagnosis Date   No pertinent past medical history    Past Surgical History:  Procedure Laterality Date   NO PAST SURGERIES     Allergies  Allergen Reactions   Doxycycline  Nausea Only       Medications: Outpatient Medications Prior to Visit  Medication Sig   albuterol  (VENTOLIN  HFA) 108 (90 Base) MCG/ACT inhaler Inhale 1-2 puffs into the lungs every 4 (four) hours as needed for wheezing or shortness of breath.   AMBULATORY NON FORMULARY MEDICATION Continuous positive airway pressure (CPAP) machine set on AutoPAP (4-20 cmH2O), with all supplemental supplies as needed.   cetirizine (ZYRTEC) 10 MG tablet Take 10 mg by mouth daily.   [DISCONTINUED] amoxicillin -clavulanate (AUGMENTIN ) 875-125 MG tablet Take 1 tablet by mouth every 12 (twelve) hours. (Patient not taking: Reported on 01/29/2023)   [DISCONTINUED] predniSONE  (DELTASONE ) 20 MG tablet Take 2 tablets (40 mg total) by mouth daily with breakfast. (Patient not taking: Reported on 01/29/2023)   No facility-administered medications prior to visit.    Allergies  Allergen  Reactions   Doxycycline  Nausea Only    Patient Care Team: Bryan Mini, NP as PCP - General (Nurse Practitioner) Bryan Redell RAMAN, MD as PCP - Cardiology (Cardiology)  Review of Systems  Constitutional:  Negative for chills, diaphoresis, fever, malaise/fatigue and weight loss.  HENT:  Positive for ear discharge, ear pain, nosebleeds and sore throat. Negative for congestion, hearing loss, sinus pain and tinnitus.   Eyes:  Negative for blurred vision, double vision, photophobia, pain, discharge and redness.  Respiratory:  Positive for shortness of breath, wheezing and stridor. Negative for cough, hemoptysis and sputum production.   Cardiovascular:  Negative for chest pain, palpitations, orthopnea, claudication, leg swelling and PND.  Gastrointestinal:  Negative for abdominal pain, blood in stool, constipation, diarrhea, heartburn, melena, nausea and vomiting.  Genitourinary:  Negative for dysuria, flank pain, frequency, hematuria and urgency.  Musculoskeletal:  Negative for back pain, falls, joint pain, myalgias and neck pain.  Skin:  Negative for itching and rash.  Neurological:  Negative for dizziness, tingling, tremors, sensory change, speech change, focal weakness, seizures, loss of consciousness, weakness and headaches.  Endo/Heme/Allergies:  Negative for environmental allergies and polydipsia. Does not bruise/bleed easily.  Psychiatric/Behavioral:  Negative for depression, hallucinations, memory loss, substance abuse and suicidal ideas. The patient is not nervous/anxious and does not have insomnia.       Objective  BP 124/76 (BP Location: Right Arm, Cuff Size: Normal)   Pulse 92   Resp 20   Ht 5' 11 (1.803 m)   Wt 279 lb 9.6 oz (126.8 kg)  SpO2 97%   BMI 39.00 kg/m     Physical Exam Constitutional:      General: He is not in acute distress.    Appearance: Normal appearance. He is obese. He is not ill-appearing, toxic-appearing or diaphoretic.  HENT:     Head:  Normocephalic and atraumatic.     Jaw: There is normal jaw occlusion.     Right Ear: Tympanic membrane, ear canal and external ear normal. There is no impacted cerumen.     Left Ear: Tympanic membrane, ear canal and external ear normal. There is no impacted cerumen.     Nose: Nose normal. No congestion or rhinorrhea.     Mouth/Throat:     Mouth: Mucous membranes are moist.     Pharynx: Oropharynx is clear. No oropharyngeal exudate or posterior oropharyngeal erythema.  Eyes:     General: No scleral icterus.       Right eye: No discharge.        Left eye: No discharge.     Extraocular Movements: Extraocular movements intact.     Conjunctiva/sclera: Conjunctivae normal.     Pupils: Pupils are equal, round, and reactive to light.  Neck:     Vascular: No carotid bruit.  Cardiovascular:     Rate and Rhythm: Normal rate and regular rhythm.     Pulses: Normal pulses.          Radial pulses are 2+ on the right side and 2+ on the left side.     Heart sounds: Normal heart sounds. No murmur heard.    No gallop.  Pulmonary:     Effort: Pulmonary effort is normal. No respiratory distress.     Breath sounds: No stridor. No wheezing, rhonchi or rales.  Chest:     Chest wall: No tenderness.  Abdominal:     General: Bowel sounds are normal. There is no distension.     Palpations: Abdomen is soft. There is no mass.     Tenderness: There is no abdominal tenderness. There is no guarding.  Musculoskeletal:        General: No swelling, tenderness, deformity or signs of injury. Normal range of motion.     Cervical back: Normal range of motion. No rigidity or tenderness.     Right lower leg: No edema.     Left lower leg: No edema.  Lymphadenopathy:     Cervical: No cervical adenopathy.  Skin:    General: Skin is warm.     Capillary Refill: Capillary refill takes less than 2 seconds.     Coloration: Skin is not jaundiced or pale.     Findings: No bruising, erythema, lesion or rash.  Neurological:      General: No focal deficit present.     Mental Status: He is alert. Mental status is at baseline.     Cranial Nerves: No cranial nerve deficit.     Sensory: No sensory deficit.     Motor: No weakness.     Coordination: Coordination is intact.     Gait: Gait normal.  Psychiatric:        Attention and Perception: Attention and perception normal.        Mood and Affect: Mood normal.        Speech: Speech normal.        Behavior: Behavior normal. Behavior is cooperative.        Thought Content: Thought content normal.        Cognition and Memory: Cognition and memory  normal.        Judgment: Judgment normal.      Most recent fall risk assessment:    05/17/2022    9:13 AM  Fall Risk   Falls in the past year? 0  Number falls in past yr: 0  Injury with Fall? 0  Risk for fall due to : No Fall Risks  Follow up Falls evaluation completed    Most recent depression screenings:    03/05/2023   11:39 AM 05/17/2022    9:13 AM  PHQ 2/9 Scores  PHQ - 2 Score 0 0    Last CBC Lab Results  Component Value Date   WBC 5.9 07/29/2020   HGB 15.0 07/29/2020   HCT 44.9 07/29/2020   MCV 87.9 07/29/2020   MCH 29.4 07/29/2020   RDW 12.5 07/29/2020   PLT 279 07/29/2020   Last metabolic panel Lab Results  Component Value Date   GLUCOSE 85 04/27/2021   NA 139 04/27/2021   K 4.2 04/27/2021   CL 103 04/27/2021   CO2 21 04/27/2021   BUN 11 04/27/2021   CREATININE 0.90 04/27/2021   EGFR 114 04/27/2021   CALCIUM 9.1 04/27/2021   PROT 7.1 07/29/2020   BILITOT 0.5 07/29/2020   AST 19 07/29/2020   ALT 25 07/29/2020   Last lipids Lab Results  Component Value Date   CHOL 160 07/29/2020   HDL 39 (L) 07/29/2020   LDLCALC 103 (H) 07/29/2020   TRIG 88 07/29/2020   CHOLHDL 4.1 07/29/2020    Last thyroid  functions Lab Results  Component Value Date   TSH 0.650 04/27/2021    No results found for any visits on 03/05/23.    Assessment & Plan   Annual wellness visit done today  including the all of the following: Reviewed patient's Family Medical History Reviewed and updated list of patient's medical providers Assessment of cognitive impairment was done Assessed patient's functional ability Established a written schedule for health screening services Health Risk Assessent Completed and Reviewed  Exercise Activities and Dietary recommendations  Goals   None     Immunization History  Administered Date(s) Administered   Hepatitis B 05/05/1996, 06/08/1996, 07/28/1998   Influenza, Seasonal, Injecte, Preservative Fre 12/13/2018, 11/28/2022   Influenza,inj,Quad PF,6-35 Mos 10/28/2019   Influenza-Unspecified 11/07/2020   MMR 05/08/1986, 01/04/1987   PFIZER(Purple Top)SARS-COV-2 Vaccination 01/20/2019, 02/08/2019, 10/28/2019   Tdap 03/29/2013, 04/10/2013, 08/30/2014, 05/08/2015    Health Maintenance  Topic Date Due   COVID-19 Vaccine (4 - 2024-25 season) 03/21/2023 (Originally 09/30/2022)   DTaP/Tdap/Td (5 - Td or Tdap) 05/07/2025   INFLUENZA VACCINE  Completed   HPV VACCINES  Aged Out   Hepatitis C Screening  Discontinued   HIV Screening  Discontinued     Discussed health benefits of physical activity, and encouraged him to engage in regular exercise appropriate for his age and condition.    Problem List Items Addressed This Visit   None Visit Diagnoses       Obstructive sleep apnea    -  Primary Pt had sleep study over a year ago and was a candidate for CPAP but never received CPAP. 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 Requires new sleep study to revaluate need for CPAP.   Relevant Orders   Split night study     Annual physical exam  No other major health conditions other than sinus infections. Pt aware of extra weight (BMI) but  has new baby at home and has let weight management behavior fall to the side. Included weight loss tips, diet and health information in  MyChart AVS      Relevant Orders   CBC with Differential/Platelet   CMP14+EGFR   Lipid panel     Lipid screening  Elevated BMI and lack of exercise     Relevant Orders   Lipid panel     Class 2 severe obesity due to excess calories with serious comorbidity and body mass index (BMI) of 36.0 to 36.9 in adult [facility])    Elevated BMI and lack of exercise     Relevant Orders   Hemoglobin A1c   TSH     Acute non-recurrent maxillary sinusitis  Pt scheduled for turbinate surgery late February because of enlarged turbinates that obstruct sinus drainage and subsequently cause frequent sinus infections from respiratory infections brought home from oldest child. Pt states family has history of sinus problems that some have had surgery to correct but are not listed in family history. Pt has infant and older sibling home with RSV. Pt states his sinuses (maxillary and frontal) are painful to palpate and his left hear has sharp pain with valsalva. Pt has had several sinus infections treated with antibiotics. Pt states that they go away and he gets reinfected from his older child. Has been seen by [facility] for sinus infections and evaluation for surgery to turbinates. Bilateral TM intact pearly with reflection. Neg edema or fluid line noted bilaterally. Pt voice sounds congested. visible turbinates pink and moist. Oropharynx pink and moist with out edema or erythema. Recurrent and persistent sinus infections. Start Augmentin  and return to clinic if symptoms worsen to don't improve    Relevant Medications   amoxicillin -clavulanate (AUGMENTIN ) 875-125 MG tablet       Nancyann JONETTA Huh, RN Student APRN

## 2023-03-05 NOTE — Progress Notes (Signed)
 Medical screening examination/treatment was performed by qualified clinical staff member and as supervising provider I was immediately available for consultation/collaboration. I have reviewed documentation and agree with assessment and plan.  Note: Decision to treat current sinus symptoms with antibiotics made due to tenderness to palpation over maxillary sinuses and longer than 14 days of symptoms. Upcoming sinus surgery and would like to make sure any active infection is treated prior to the procedure.  Zada FREDRIK Palin, DNP, APRN, FNP-BC [facility]

## 2023-03-06 ENCOUNTER — Encounter: Payer: Self-pay | Admitting: Medical-Surgical

## 2023-03-06 LAB — CMP14+EGFR
ALT: 44 [IU]/L (ref 0–44)
AST: 26 [IU]/L (ref 0–40)
Albumin: 4.8 g/dL (ref 4.1–5.1)
Alkaline Phosphatase: 90 [IU]/L (ref 44–121)
BUN/Creatinine Ratio: 13 (ref 9–20)
BUN: 13 mg/dL (ref 6–20)
Bilirubin Total: 0.8 mg/dL (ref 0.0–1.2)
CO2: 22 mmol/L (ref 20–29)
Calcium: 9.8 mg/dL (ref 8.7–10.2)
Chloride: 103 mmol/L (ref 96–106)
Creatinine, Ser: 1.04 mg/dL (ref 0.76–1.27)
Globulin, Total: 2.6 g/dL (ref 1.5–4.5)
Glucose: 100 mg/dL — ABNORMAL HIGH (ref 70–99)
Potassium: 5 mmol/L (ref 3.5–5.2)
Sodium: 140 mmol/L (ref 134–144)
Total Protein: 7.4 g/dL (ref 6.0–8.5)
eGFR: 95 mL/min/{1.73_m2} (ref >59)

## 2023-03-06 LAB — HEMOGLOBIN A1C
Est. average glucose Bld gHb Est-mCnc: 120 mg/dL
Hgb A1c MFr Bld: 5.8 % — ABNORMAL HIGH (ref 4.8–5.6)

## 2023-03-06 LAB — CBC WITH DIFFERENTIAL/PLATELET
Basophils Absolute: 0 10*3/uL (ref 0.0–0.2)
Basos: 1 %
EOS (ABSOLUTE): 0.2 10*3/uL (ref 0.0–0.4)
Eos: 3 %
Hematocrit: 46.3 % (ref 37.5–51.0)
Hemoglobin: 16 g/dL (ref 13.0–17.7)
Immature Grans (Abs): 0 10*3/uL (ref 0.0–0.1)
Immature Granulocytes: 0 %
Lymphocytes Absolute: 1.3 10*3/uL (ref 0.7–3.1)
Lymphs: 19 %
MCH: 30.6 pg (ref 26.6–33.0)
MCHC: 34.6 g/dL (ref 31.5–35.7)
MCV: 89 fL (ref 79–97)
Monocytes Absolute: 0.6 10*3/uL (ref 0.1–0.9)
Monocytes: 9 %
Neutrophils Absolute: 4.7 10*3/uL (ref 1.4–7.0)
Neutrophils: 68 %
Platelets: 256 10*3/uL (ref 150–450)
RBC: 5.23 x10E6/uL (ref 4.14–5.80)
RDW: 12.3 % (ref 11.6–15.4)
WBC: 6.8 10*3/uL (ref 3.4–10.8)

## 2023-03-06 LAB — LIPID PANEL
Chol/HDL Ratio: 4.8 {ratio} (ref 0.0–5.0)
Cholesterol, Total: 189 mg/dL (ref 100–199)
HDL: 39 mg/dL — ABNORMAL LOW (ref >39)
LDL Chol Calc (NIH): 126 mg/dL — ABNORMAL HIGH (ref 0–99)
Triglycerides: 135 mg/dL (ref 0–149)
VLDL Cholesterol Cal: 24 mg/dL (ref 5–40)

## 2023-03-06 LAB — TSH: TSH: 1.15 u[IU]/mL (ref 0.450–4.500)

## 2023-03-12 ENCOUNTER — Other Ambulatory Visit: Payer: Self-pay | Admitting: Medical-Surgical

## 2023-03-12 DIAGNOSIS — J0100 Acute maxillary sinusitis, unspecified: Secondary | ICD-10-CM

## 2023-03-13 ENCOUNTER — Other Ambulatory Visit: Payer: Self-pay | Admitting: Medical-Surgical

## 2023-03-13 DIAGNOSIS — J0100 Acute maxillary sinusitis, unspecified: Secondary | ICD-10-CM

## 2023-03-14 ENCOUNTER — Other Ambulatory Visit (HOSPITAL_BASED_OUTPATIENT_CLINIC_OR_DEPARTMENT_OTHER): Payer: Self-pay

## 2023-03-25 ENCOUNTER — Encounter (HOSPITAL_BASED_OUTPATIENT_CLINIC_OR_DEPARTMENT_OTHER): Admission: RE | Payer: Self-pay | Source: Home / Self Care

## 2023-03-25 ENCOUNTER — Ambulatory Visit (HOSPITAL_BASED_OUTPATIENT_CLINIC_OR_DEPARTMENT_OTHER)
Admission: RE | Admit: 2023-03-25 | Payer: Commercial Managed Care - PPO | Source: Home / Self Care | Admitting: Otolaryngology

## 2023-03-25 SURGERY — NASAL SEPTOPLASTY WITH TURBINATE REDUCTION
Anesthesia: General

## 2023-03-28 ENCOUNTER — Encounter: Payer: Self-pay | Admitting: Medical-Surgical

## 2023-03-28 ENCOUNTER — Encounter (INDEPENDENT_AMBULATORY_CARE_PROVIDER_SITE_OTHER): Payer: Commercial Managed Care - PPO

## 2023-04-19 ENCOUNTER — Encounter: Payer: Self-pay | Admitting: Medical-Surgical

## 2023-06-01 ENCOUNTER — Telehealth: Admitting: Family Medicine

## 2023-06-01 DIAGNOSIS — M549 Dorsalgia, unspecified: Secondary | ICD-10-CM | POA: Diagnosis not present

## 2023-06-01 MED ORDER — CYCLOBENZAPRINE HCL 10 MG PO TABS: 10.0000 mg | ORAL_TABLET | Freq: Three times a day (TID) | ORAL | 0 refills | Status: AC | PRN

## 2023-06-01 MED ORDER — NAPROXEN 500 MG PO TABS: 500.0000 mg | ORAL_TABLET | Freq: Two times a day (BID) | ORAL | 0 refills | Status: AC

## 2023-06-01 NOTE — Patient Instructions (Signed)
 Bryan Jackson, thank you for joining Bryan Roys, PA-C for today's virtual visit.  While this provider is not your primary care provider (PCP), if your PCP is located in our provider database this encounter information will be shared with them immediately following your visit.   A [facility] MyChart account gives you access to today's visit and all your visits, tests, and labs performed at [facility] " click here if you don't have a [facility] MyChart account or go to [email or web address]  Consent: (Patient) Bryan Jackson provided verbal consent for this virtual visit at the beginning of the encounter.  Current Medications:  Current Outpatient Medications:    cyclobenzaprine (FLEXERIL) 10 MG tablet, Take 1 tablet (10 mg total) by mouth 3 (three) times daily as needed for up to 7 days for muscle spasms., Disp: 21 tablet, Rfl: 0   naproxen (NAPROSYN) 500 MG tablet, Take 1 tablet (500 mg total) by mouth 2 (two) times daily with a meal for 7 days., Disp: 14 tablet, Rfl: 0   albuterol  (VENTOLIN  HFA) 108 (90 Base) MCG/ACT inhaler, Inhale 1-2 puffs into the lungs every 4 (four) hours as needed for wheezing or shortness of breath., Disp: 8 g, Rfl: 0   AMBULATORY NON FORMULARY MEDICATION, Continuous positive airway pressure (CPAP) machine set on AutoPAP (4-20 cmH2O), with all supplemental supplies as needed., Disp: 1 each, Rfl: 0   amoxicillin -clavulanate (AUGMENTIN ) 875-125 MG tablet, Take 1 tablet by mouth 2 (two) times daily., Disp: 14 tablet, Rfl: 0   cetirizine (ZYRTEC) 10 MG tablet, Take 10 mg by mouth daily., Disp: , Rfl:    Medications ordered in this encounter:  Meds ordered this encounter  Medications   cyclobenzaprine (FLEXERIL) 10 MG tablet    Sig: Take 1 tablet (10 mg total) by mouth 3 (three) times daily as needed for up to 7 days for muscle spasms.    Dispense:  21 tablet    Refill:  0   naproxen (NAPROSYN) 500 MG tablet    Sig: Take 1 tablet (500 mg  total) by mouth 2 (two) times daily with a meal for 7 days.    Dispense:  14 tablet    Refill:  0     *If you need refills on other medications prior to your next appointment, please contact your pharmacy*  Follow-Up: Call back or seek an in-person evaluation if the symptoms worsen or if the condition fails to improve as anticipated.  [facility] [phone]  Other Instructions Acute Back Pain, Adult Acute back pain is sudden and usually short-lived. It is often caused by an injury to the muscles and tissues in the back. The injury may result from: A muscle, tendon, or ligament getting overstretched or torn. Ligaments are tissues that connect bones to each other. Lifting something improperly can cause a back strain. Wear and tear (degeneration) of the spinal disks. Spinal disks are circular tissue that provide cushioning between the bones of the spine (vertebrae). Twisting motions, such as while playing sports or doing yard work. A hit to the back. Arthritis. You may have a physical exam, lab tests, and imaging tests to find the cause of your pain. Acute back pain usually goes away with rest and home care. Follow these instructions at home: Managing pain, stiffness, and swelling Take over-the-counter and prescription medicines only as told by your health care provider. Treatment may include medicines for pain and inflammation that are taken by mouth or applied to the skin, or  muscle relaxants. Your health care provider may recommend applying ice during the first 24-48 hours after your pain starts. To do this: Put ice in a plastic bag. Place a towel between your skin and the bag. Leave the ice on for 20 minutes, 2-3 times a day. Remove the ice if your skin turns bright red. This is very important. If you cannot feel pain, heat, or cold, you have a greater risk of damage to the area. If directed, apply heat to the affected area as often as told by your health care provider.  Use the heat source that your health care provider recommends, such as a moist heat pack or a heating pad. Place a towel between your skin and the heat source. Leave the heat on for 20-30 minutes. Remove the heat if your skin turns bright red. This is especially important if you are unable to feel pain, heat, or cold. You have a greater risk of getting burned. Activity  Do not stay in bed. Staying in bed for more than 1-2 days can delay your recovery. Sit up and stand up straight. Avoid leaning forward when you sit or hunching over when you stand. If you work at a desk, sit close to it so you do not need to lean over. Keep your chin tucked in. Keep your neck drawn back, and keep your elbows bent at a 90-degree angle (right angle). Sit high and close to the steering wheel when you drive. Add lower back (lumbar) support to your car seat, if needed. Take short walks on even surfaces as soon as you are able. Try to increase the length of time you walk each day. Do not sit, drive, or stand in one place for more than 30 minutes at a time. Sitting or standing for long periods of time can put stress on your back. Do not drive or use heavy machinery while taking prescription pain medicine. Use proper lifting techniques. When you bend and lift, use positions that put less stress on your back: Rigby your knees. Keep the load close to your body. Avoid twisting. Exercise regularly as told by your health care provider. Exercising helps your back heal faster and helps prevent back injuries by keeping muscles strong and flexible. Work with a physical therapist to make a safe exercise program, as recommended by your health care provider. Do any exercises as told by your physical therapist. Lifestyle Maintain a healthy weight. Extra weight puts stress on your back and makes it difficult to have good posture. Avoid activities or situations that make you feel anxious or stressed. Stress and anxiety increase muscle  tension and can make back pain worse. Learn ways to manage anxiety and stress, such as through exercise. General instructions Sleep on a firm mattress in a comfortable position. Try lying on your side with your knees slightly bent. If you lie on your back, put a pillow under your knees. Keep your head and neck in a straight line with your spine (neutral position) when using electronic equipment like smartphones or pads. To do this: Raise your smartphone or pad to look at it instead of bending your head or neck to look down. Put the smartphone or pad at the level of your face while looking at the screen. Follow your treatment plan as told by your health care provider. This may include: Cognitive or behavioral therapy. Acupuncture or massage therapy. Meditation or yoga. Contact a health care provider if: You have pain that is not relieved with rest  or medicine. You have increasing pain going down into your legs or buttocks. Your pain does not improve after 2 weeks. You have pain at night. You lose weight without trying. You have a fever or chills. You develop nausea or vomiting. You develop abdominal pain. Get help right away if: You develop new bowel or bladder control problems. You have unusual weakness or numbness in your arms or legs. You feel faint. These symptoms may represent a serious problem that is an emergency. Do not wait to see if the symptoms will go away. Get medical help right away. Call your local emergency services (911 in the [place] Do not drive yourself to the hospital. Summary Acute back pain is sudden and usually short-lived. Use proper lifting techniques. When you bend and lift, use positions that put less stress on your back. Take over-the-counter and prescription medicines only as told by your health care provider, and apply heat or ice as told. This information is not intended to replace advice given to you by your health care provider. Make sure you discuss any  questions you have with your health care provider. Document Revised: 04/08/2020 Document Reviewed: 04/08/2020 Elsevier Patient Education  2024 [employer]   If you have been instructed to have an in-person evaluation today at a local Urgent Care facility, please use the link below. It will take you to a list of all of our available [facility], including address, phone number and hours of operation. Please do not delay care.  [facility]  If you or a family member do not have a primary care provider, use the link below to schedule a visit and establish care. When you choose a [facility] primary care physician or advanced practice provider, you gain a long-term partner in health. Find a Primary Care Provider  Learn more about [facility]'s in-office and virtual care options: [facility] - Get Care Now

## 2023-06-01 NOTE — Progress Notes (Signed)
 Virtual Visit Consent   Bryan Jackson, you are scheduled for a virtual visit with a [facility] provider today. Just as with appointments in the office, your consent must be obtained to participate. Your consent will be active for this visit and any virtual visit you may have with one of our providers in the next 365 days. If you have a MyChart account, a copy of this consent can be sent to you electronically.  As this is a virtual visit, video technology does not allow for your provider to perform a traditional examination. This may limit your provider's ability to fully assess your condition. If your provider identifies any concerns that need to be evaluated in person or the need to arrange testing (such as labs, EKG, etc.), we will make arrangements to do so. Although advances in technology are sophisticated, we cannot ensure that it will always work on either your end or our end. If the connection with a video visit is poor, the visit may have to be switched to a telephone visit. With either a video or telephone visit, we are not always able to ensure that we have a secure connection.  By engaging in this virtual visit, you consent to the provision of healthcare and authorize for your insurance to be billed (if applicable) for the services provided during this visit. Depending on your insurance coverage, you may receive a charge related to this service.  I need to obtain your verbal consent now. Are you willing to proceed with your visit today? Bryan Jackson has provided verbal consent on 06/01/2023 for a virtual visit (video or telephone). [name], [place]  Date: 06/01/2023 3:39 PM   Virtual Visit via Video Note   I, [name], connected with  Bryan Jackson  ([id number], [birth year]) on 06/01/23 at  3:30 PM EDT by a video-enabled telemedicine application and verified that I am speaking with the correct person using two identifiers.  Location: Patient: Virtual Visit Location  Patient: Home Provider: Virtual Visit Location Provider: Home Office   I discussed the limitations of evaluation and management by telemedicine and the availability of in person appointments. The patient expressed understanding and agreed to proceed.    History of Present Illness: Bryan Jackson is a 38 y.o. who identifies as a male who was assigned male at birth, and is being seen today for c/o moving a heavy boxes earlier , about 100 pound box and now has muscle spasms and pain in back underneath shoulder blades and in the middle of his back.  Pt states taking Ibuprofen  and Tylenol but has not helped.  Pt denies bowel or bladder issues.  Pt states he is a little nauseous because of the pain. Pt states when it first occurred he had a hard time sitting up or standing up straight. Pt states he had to lay down for about 15 mins before he was able to stand.  Pt states he is able to sit and stand straight but still has pain.   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   Environmental and seasonal allergies 12/27/2021   Eustachian tube dysfunction, bilateral 12/27/2021    Allergies:  Allergies  Allergen Reactions   Doxycycline  Nausea Only   Medications:  Current Outpatient Medications:    cyclobenzaprine (FLEXERIL) 10 MG tablet, Take  1 tablet (10 mg total) by mouth 3 (three) times daily as needed for up to 7 days for muscle spasms., Disp: 21 tablet, Rfl: 0   naproxen (NAPROSYN) 500 MG tablet, Take 1 tablet (500 mg total) by mouth 2 (two) times daily with a meal for 7 days., Disp: 14 tablet, Rfl: 0   albuterol  (VENTOLIN  HFA) 108 (90 Base) MCG/ACT inhaler, Inhale 1-2 puffs into the lungs every 4 (four) hours as needed for wheezing or shortness of breath., Disp: 8 g, Rfl: 0   AMBULATORY NON FORMULARY MEDICATION,  Continuous positive airway pressure (CPAP) machine set on AutoPAP (4-20 cmH2O), with all supplemental supplies as needed., Disp: 1 each, Rfl: 0   amoxicillin -clavulanate (AUGMENTIN ) 875-125 MG tablet, Take 1 tablet by mouth 2 (two) times daily., Disp: 14 tablet, Rfl: 0   cetirizine (ZYRTEC) 10 MG tablet, Take 10 mg by mouth daily., Disp: , Rfl:   Observations/Objective: Patient is well-developed, well-nourished in no acute distress.  Resting comfortably at home.  Head is normocephalic, atraumatic.  No labored breathing.  Speech is clear and coherent with logical content.  Patient is alert and oriented at baseline.    Assessment and Plan: 1. Acute back pain, unspecified back location, unspecified back pain laterality (Primary) - cyclobenzaprine (FLEXERIL) 10 MG tablet; Take 1 tablet (10 mg total) by mouth 3 (three) times daily as needed for up to 7 days for muscle spasms.  Dispense: 21 tablet; Refill: 0 - naproxen (NAPROSYN) 500 MG tablet; Take 1 tablet (500 mg total) by mouth 2 (two) times daily with a meal for 7 days.  Dispense: 14 tablet; Refill: 0  -Start short course of Naproxen and Cyclobenzaprine  -Pt advised to follow up with PCP or in person urgent care for worsening or persistent symptoms.   Follow Up Instructions: I discussed the assessment and treatment plan with the patient. The patient was provided an opportunity to ask questions and all were answered. The patient agreed with the plan and demonstrated an understanding of the instructions.  A copy of instructions were sent to the patient via MyChart unless otherwise noted below.    The patient was advised to call back or seek an in-person evaluation if the symptoms worsen or if the condition fails to improve as anticipated.    [name], PA-C

## 2023-07-28 ENCOUNTER — Telehealth: Admitting: Family

## 2023-07-28 DIAGNOSIS — J019 Acute sinusitis, unspecified: Secondary | ICD-10-CM

## 2023-07-28 MED ORDER — AMOXICILLIN-POT CLAVULANATE 875-125 MG PO TABS: 1.0000 | ORAL_TABLET | Freq: Two times a day (BID) | ORAL | 0 refills | Status: AC

## 2023-07-28 NOTE — Addendum Note (Signed)
 Addended byBETHA ROLAN BERTHOLD on: 07/28/2023 12:56 PM   Modules accepted: Orders

## 2023-07-28 NOTE — Progress Notes (Signed)

## 2023-07-30 ENCOUNTER — Encounter: Payer: Self-pay | Admitting: Medical-Surgical

## 2023-07-30 MED ORDER — BENZONATATE 200 MG PO CAPS
200.0000 mg | ORAL_CAPSULE | Freq: Three times a day (TID) | ORAL | 0 refills | Status: DC | PRN
Start: 2023-07-30 — End: 2023-12-16

## 2023-08-13 ENCOUNTER — Telehealth: Admitting: Physician Assistant

## 2023-08-13 ENCOUNTER — Encounter: Payer: Self-pay | Admitting: Medical-Surgical

## 2023-08-13 DIAGNOSIS — H60332 Swimmer's ear, left ear: Secondary | ICD-10-CM

## 2023-08-13 MED ORDER — CIPROFLOXACIN-DEXAMETHASONE 0.3-0.1 % OT SUSP: OTIC | 0 refills | Status: DC

## 2023-08-13 NOTE — Progress Notes (Signed)
 I have spent 5 minutes in review of e-visit questionnaire, review and updating patient chart, medical decision making and response to patient.   Piedad Climes, PA-C

## 2023-08-13 NOTE — Telephone Encounter (Signed)
 Attempted call to patient. Left a voice mail message requesting a return call to assist in scheduling an appointment.

## 2023-08-13 NOTE — Progress Notes (Signed)
 E Visit for Ear Pain - Swimmer's Ear  We are sorry that you are not feeling well. Here is how we plan to help!  Based on what you have shared with me it looks like you have Swimmer's Ear.  Swimmer's ear is a redness or swelling, irritation, or infection of your outer ear canal. These symptoms usually occur within a few days of swimming. Your ear canal is a tube that goes from the opening of the ear to the eardrum.  When water stays in your ear canal, germs can grow.  This is a painful condition that often happens to children and swimmers of all ages.  It is not contagious and oral antibiotics are not required to treat uncomplicated swimmer's ear.  The usual symptoms include:    Itchiness inside the ear  Redness or a sense of swelling in the ear  Pain when the ear is tugged on when pressure is placed on the ear  Pus draining from the infected ear    I have prescribed Ciprodex  to instill 4 drops into the affected ear twice daily for 7 days.  In certain cases, swimmer's ear may progress to a more serious bacterial infection of the middle or inner ear.  If you have a fever 102 and up and significantly worsening symptoms, this could indicate a more serious infection moving to the middle/inner and needs face to face evaluation in an office by a provider.  Your symptoms should improve over the next 3 days and should resolve in about 7 days.  Be sure to complete ALL of your prescription.  HOME CARE: Wash your hands frequently. If you are prescribed an ear drop, do not place the tip of the bottle on your ear or touch it with your fingers. You can take Acetaminophen 650 mg every 4-6 hours as needed for pain.  If pain is severe or moderate, you can apply a heating pad (set on low) or hot water bottle (wrapped in a towel) to outer ear for 20 minutes.  This will also increase drainage. Avoid ear plugs Do not go swimming until the symptoms are gone Do not use Q-tips After showers, help the water run out  by tilting your head to one side.   GET HELP RIGHT AWAY IF: Fever is over 102.2 degrees. You develop progressive ear pain or hearing loss. Ear symptoms persist longer than 3 days after treatment.  MAKE SURE YOU: Understand these instructions. Will watch your condition. Will get help right away if you are not doing well or get worse.  TO PREVENT SWIMMER'S EAR: Use a bathing cap or custom fitted swim molds to keep your ears dry. Towel off after swimming to dry your ears. Tilt your head or pull your earlobes to allow the water to escape your ear canal. If there is still water in your ears, consider using a hairdryer on the lowest setting.  Thank you for choosing an e-visit.  Your e-visit answers were reviewed by a board certified advanced clinical practitioner to complete your personal care plan. Depending upon the condition, your plan could have included both over the counter or prescription medications.  Please review your pharmacy choice. Make sure the pharmacy is open so you can pick up the prescription now. If there is a problem, you may contact your provider through [employer] and have the prescription routed to another pharmacy.  Your safety is important to us . If you have drug allergies check your prescription carefully.   For the  next 24 hours you can use MyChart to ask questions about today's visit, request a non-urgent call back, or ask for a work or school excuse. You will get an email with a survey after your eVisit asking about your experience. We would appreciate your feedback. I hope that your e-visit has been valuable and will aid in your recovery.

## 2023-10-10 ENCOUNTER — Telehealth: Admitting: Physician Assistant

## 2023-10-10 DIAGNOSIS — B9689 Other specified bacterial agents as the cause of diseases classified elsewhere: Secondary | ICD-10-CM | POA: Diagnosis not present

## 2023-10-10 DIAGNOSIS — J019 Acute sinusitis, unspecified: Secondary | ICD-10-CM | POA: Diagnosis not present

## 2023-10-10 MED ORDER — AMOXICILLIN-POT CLAVULANATE 875-125 MG PO TABS: 1.0000 | ORAL_TABLET | Freq: Two times a day (BID) | ORAL | 0 refills | Status: DC

## 2023-10-10 NOTE — Progress Notes (Signed)

## 2023-10-30 ENCOUNTER — Telehealth: Admitting: Physician Assistant

## 2023-10-30 DIAGNOSIS — J329 Chronic sinusitis, unspecified: Secondary | ICD-10-CM

## 2023-10-30 NOTE — Progress Notes (Signed)
  Because of recent treatment for sinusitis via e-visit with course of Augmentin  and recurrence of symptoms, I feel your condition warrants further evaluation and I recommend that you be seen in a face-to-face visit.   NOTE: There will be NO CHARGE for this E-Visit   If you are having a true medical emergency, please call 911.     For an urgent face to face visit, [facility] has multiple urgent care centers for your convenience.  Click the link below for the full list of locations and hours, walk-in wait times, appointment scheduling options and driving directions:  Urgent Care - [place], [place], [place], [place], [place], [place]   [place]     Your MyChart E-visit questionnaire answers were reviewed by a board certified advanced clinical practitioner to complete your personal care plan based on your specific symptoms.    Thank you for using e-Visits.

## 2023-11-04 ENCOUNTER — Other Ambulatory Visit: Payer: Self-pay

## 2023-12-14 ENCOUNTER — Telehealth: Admitting: Physician Assistant

## 2023-12-14 DIAGNOSIS — H109 Unspecified conjunctivitis: Secondary | ICD-10-CM

## 2023-12-14 MED ORDER — OFLOXACIN 0.3 % OP SOLN: 2.0000 [drp] | Freq: Four times a day (QID) | OPHTHALMIC | 0 refills | Status: AC

## 2023-12-14 NOTE — Progress Notes (Signed)

## 2023-12-16 ENCOUNTER — Telehealth: Admitting: Physician Assistant

## 2023-12-16 DIAGNOSIS — H60392 Other infective otitis externa, left ear: Secondary | ICD-10-CM

## 2023-12-16 DIAGNOSIS — H6992 Unspecified Eustachian tube disorder, left ear: Secondary | ICD-10-CM

## 2023-12-16 MED ORDER — FLUTICASONE PROPIONATE 50 MCG/ACT NA SUSP: 2.0000 | Freq: Every day | NASAL | 0 refills | Status: AC

## 2023-12-16 MED ORDER — CIPROFLOXACIN-DEXAMETHASONE 0.3-0.1 % OT SUSP: 4.0000 [drp] | Freq: Two times a day (BID) | OTIC | 0 refills | Status: AC

## 2023-12-16 NOTE — Progress Notes (Signed)
 E Visit for Swimmer's Ear  We are sorry that you are not feeling well. Here is how we plan to help!  I have prescribed: Ciprofloxacin  0.3% and dexamethasone  0.1% otic suspension four drops in affected ears two times a day for 7 days  I have also prescribed Flonase  Use 2 sprays in each nostril daily for 10-14 days.   In certain cases swimmer's ear may progress to a more serious bacterial infection of the middle or inner ear.  If you have a fever 102 and up and significantly worsening symptoms, this could indicate a more serious infection moving to the middle/inner and needs face to face evaluation in an office by a provider.  Your symptoms should improve over the next 3 days and should resolve in about 7 days.  HOME CARE:  Wash your hands frequently. Do not place the tip of the bottle on your ear or touch it with your fingers. You can take Acetominophen 650 mg every 4-6 hours as needed for pain.  If pain is severe or moderate, you can apply a heating pad (set on low) or hot water bottle (wrapped in a towel) to outer ear for 20 minutes.  This will also increase drainage. Avoid ear plugs Do not use Q-tips After showers, help the water run out by tilting your head to one side.  GET HELP RIGHT AWAY IF:  Fever is over 102.2 degrees. You develop progressive ear pain or hearing loss. Ear symptoms persist longer than 3 days after treatment.  MAKE SURE YOU:  Understand these instructions. Will watch your condition. Will get help right away if you are not doing well or get worse.  TO PREVENT SWIMMER'S EAR: Use a bathing cap or custom fitted swim molds to keep your ears dry. Towel off after swimming to dry your ears. Tilt your head or pull your earlobes to allow the water to escape your ear canal. If there is still water in your ears, consider using a hairdryer on the lowest setting.  Thank you for choosing an e-visit. Your e-visit answers were reviewed by a board certified advanced  clinical practitioner to complete your personal care plan. Depending upon the condition, your plan could have included both over the counter or prescription medications. Please review your pharmacy choice. Be sure that the pharmacy you have chosen is open so that you can pick up your prescription now.  If there is a problem you may message your provider in MyChart to have the prescription routed to another pharmacy. Your safety is important to us . If you have drug allergies check your prescription carefully.  For the next 24 hours, you can use MyChart to ask questions about today's visit, request a non-urgent call back, or ask for a work or school excuse from your e-visit provider. You will get an email in the next two days asking about your experience. I hope that your e-visit has been valuable and will speed your recovery.   I have spent 5 minutes in review of e-visit questionnaire, review and updating patient chart, medical decision making and response to patient.   Delon CHRISTELLA Dickinson, PA-C

## 2023-12-17 ENCOUNTER — Telehealth: Admitting: Physician Assistant

## 2023-12-17 DIAGNOSIS — J019 Acute sinusitis, unspecified: Secondary | ICD-10-CM

## 2023-12-17 DIAGNOSIS — B9689 Other specified bacterial agents as the cause of diseases classified elsewhere: Secondary | ICD-10-CM | POA: Diagnosis not present

## 2023-12-17 MED ORDER — AMOXICILLIN-POT CLAVULANATE 875-125 MG PO TABS: 1.0000 | ORAL_TABLET | Freq: Two times a day (BID) | ORAL | 0 refills | Status: DC

## 2023-12-17 MED ORDER — PREDNISONE 10 MG (21) PO TBPK: ORAL_TABLET | ORAL | 0 refills | Status: DC

## 2023-12-17 NOTE — Progress Notes (Signed)
 Virtual Visit Consent   Bryan Jackson, you are scheduled for a virtual visit with a [facility] provider today. Just as with appointments in the office, your consent must be obtained to participate. Your consent will be active for this visit and any virtual visit you may have with one of our providers in the next 365 days. If you have a MyChart account, a copy of this consent can be sent to you electronically.  As this is a virtual visit, video technology does not allow for your provider to perform a traditional examination. This may limit your provider's ability to fully assess your condition. If your provider identifies any concerns that need to be evaluated in person or the need to arrange testing (such as labs, EKG, etc.), we will make arrangements to do so. Although advances in technology are sophisticated, we cannot ensure that it will always work on either your end or our end. If the connection with a video visit is poor, the visit may have to be switched to a telephone visit. With either a video or telephone visit, we are not always able to ensure that we have a secure connection.  By engaging in this virtual visit, you consent to the provision of healthcare and authorize for your insurance to be billed (if applicable) for the services provided during this visit. Depending on your insurance coverage, you may receive a charge related to this service.  I need to obtain your verbal consent now. Are you willing to proceed with your visit today? Bryan Jackson has provided verbal consent on 12/17/2023 for a virtual visit (video or telephone). Bryan Jackson, [place]  Date: 12/17/2023 11:48 AM   Virtual Visit via Video Note   I, Bryan Jackson, connected with  Bryan Jackson  ([id number], 1985-11-04) on 12/17/23 at 11:30 AM EST by a video-enabled telemedicine application and verified that I am speaking with the correct person using two identifiers.  Location: Patient: Virtual  Visit Location Patient: Home Provider: Virtual Visit Location Provider: Home Office   I discussed the limitations of evaluation and management by telemedicine and the availability of in person appointments. The patient expressed understanding and agreed to proceed.    History of Present Illness: Bryan Jackson is a 38 y.o. who identifies as a male who was assigned male at birth, and is being seen today for continued cough and sinus congestion now with thick nasal discharge, sinus pressure and sinus pain. Is currently being treated for otitis externa. Has history of recurrent sinusitis, followed by ENT with upcoming sinus surgery. .   Tylenol every 4 hours, Mucinex.Bryan Jackson    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   Environmental and seasonal allergies 12/27/2021   Eustachian tube dysfunction, bilateral 12/27/2021    Allergies:  Allergies  Allergen Reactions   Doxycycline  Nausea Only   Medications:  Current Outpatient Medications:    amoxicillin -clavulanate (AUGMENTIN ) 875-125 MG tablet, Take 1 tablet by mouth 2 (two) times daily., Disp: 14 tablet, Rfl: 0   predniSONE  (STERAPRED UNI-PAK 21 TAB) 10 MG (21) TBPK tablet, Take following package directions, Disp: 21 tablet, Rfl: 0   AMBULATORY NON FORMULARY MEDICATION, Continuous positive airway pressure (CPAP) machine set on AutoPAP (4-20 cmH2O), with all supplemental supplies as needed., Disp: 1 each, Rfl: 0   ciprofloxacin -dexamethasone  (  CIPRODEX ) OTIC suspension, Place 4 drops into the left ear 2 (two) times daily for 7 days., Disp: 7.5 mL, Rfl: 0   fluticasone  (FLONASE ) 50 MCG/ACT nasal spray, Place 2 sprays into both nostrils daily., Disp: 16 g, Rfl: 0   ofloxacin (OCUFLOX) 0.3 % ophthalmic solution, Place 2 drops into the left eye 4 (four) times daily for 5  days., Disp: 2 mL, Rfl: 0  Observations/Objective: Patient is well-developed, well-nourished in no acute distress.  Resting comfortably  at home.  Head is normocephalic, atraumatic.  No labored breathing.  Speech is clear and coherent with logical content.  Patient is alert and oriented at baseline.   Assessment and Plan: 1. Acute bacterial sinusitis (Primary) - amoxicillin -clavulanate (AUGMENTIN ) 875-125 MG tablet; Take 1 tablet by mouth 2 (two) times daily.  Dispense: 14 tablet; Refill: 0 - predniSONE  (STERAPRED UNI-PAK 21 TAB) 10 MG (21) TBPK tablet; Take following package directions  Dispense: 21 tablet; Refill: 0  Rx Augmentin .  Increase fluids.  Rest.  Saline nasal spray.  Probiotic.  Mucinex as directed.  Humidifier in bedroom. Prednisone  per orders. Needs to follow-up in person with ENT.    Follow Up Instructions: I discussed the assessment and treatment plan with the patient. The patient was provided an opportunity to ask questions and all were answered. The patient agreed with the plan and demonstrated an understanding of the instructions.  A copy of instructions were sent to the patient via MyChart unless otherwise noted below.    The patient was advised to call back or seek an in-person evaluation if the symptoms worsen or if the condition fails to improve as anticipated.    Bryan Velma Lunger, PA-C

## 2023-12-17 NOTE — Patient Instructions (Signed)
 Bryan Jackson, thank you for joining Bryan Velma Lunger, PA-C for today's virtual visit.  While this provider is not your primary care provider (PCP), if your PCP is located in our provider database this encounter information will be shared with them immediately following your visit.   A [place] MyChart account gives you access to today's visit and all your visits, tests, and labs performed at [facility]  click here if you don't have a [facility] MyChart account or go to [email or web address]  Consent: (Patient) Bryan Jackson provided verbal consent for this virtual visit at the beginning of the encounter.  Current Medications:  Current Outpatient Medications:    amoxicillin -clavulanate (AUGMENTIN ) 875-125 MG tablet, Take 1 tablet by mouth 2 (two) times daily., Disp: 14 tablet, Rfl: 0   predniSONE  (STERAPRED UNI-PAK 21 TAB) 10 MG (21) TBPK tablet, Take following package directions, Disp: 21 tablet, Rfl: 0   AMBULATORY NON FORMULARY MEDICATION, Continuous positive airway pressure (CPAP) machine set on AutoPAP (4-20 cmH2O), with all supplemental supplies as needed., Disp: 1 each, Rfl: 0   ciprofloxacin -dexamethasone  (CIPRODEX ) OTIC suspension, Place 4 drops into the left ear 2 (two) times daily for 7 days., Disp: 7.5 mL, Rfl: 0   fluticasone  (FLONASE ) 50 MCG/ACT nasal spray, Place 2 sprays into both nostrils daily., Disp: 16 g, Rfl: 0   ofloxacin (OCUFLOX) 0.3 % ophthalmic solution, Place 2 drops into the left eye 4 (four) times daily for 5 days., Disp: 2 mL, Rfl: 0   Medications ordered in this encounter:  Meds ordered this encounter  Medications   amoxicillin -clavulanate (AUGMENTIN ) 875-125 MG tablet    Sig: Take 1 tablet by mouth 2 (two) times daily.    Dispense:  14 tablet    Refill:  0    Supervising Provider:   BLAISE ALEENE KIDD [[id number]]   predniSONE  (STERAPRED UNI-PAK 21 TAB) 10 MG (21) TBPK tablet    Sig: Take following package directions     Dispense:  21 tablet    Refill:  0    Supervising Provider:   BLAISE ALEENE KIDD [[id number]]     *If you need refills on other medications prior to your next appointment, please contact your pharmacy*  Follow-Up: Call back or seek an in-person evaluation if the symptoms worsen or if the condition fails to improve as anticipated.  [facility] [phone]  Other Instructions Please take antibiotic as directed.  Increase fluid intake.  Use Saline nasal spray.  Take a daily multivitamin. Take all prescribed medications as directed.  Place a humidifier in the bedroom.  Please call or return clinic if symptoms are not improving.  Sinusitis Sinusitis is redness, soreness, and swelling (inflammation) of the paranasal sinuses. Paranasal sinuses are air pockets within the bones of your face (beneath the eyes, the middle of the forehead, or above the eyes). In healthy paranasal sinuses, mucus is able to drain out, and air is able to circulate through them by way of your nose. However, when your paranasal sinuses are inflamed, mucus and air can become trapped. This can allow bacteria and other germs to grow and cause infection. Sinusitis can develop quickly and last only a short time (acute) or continue over a long period (chronic). Sinusitis that lasts for more than 12 weeks is considered chronic.  CAUSES  Causes of sinusitis include: Allergies. Structural abnormalities, such as displacement of the cartilage that separates your nostrils (deviated septum), which can decrease the air flow through your nose and sinuses  and affect sinus drainage. Functional abnormalities, such as when the small hairs (cilia) that line your sinuses and help remove mucus do not work properly or are not present. SYMPTOMS  Symptoms of acute and chronic sinusitis are the same. The primary symptoms are pain and pressure around the affected sinuses. Other symptoms include: Upper toothache. Earache. Headache. Bad  breath. Decreased sense of smell and taste. A cough, which worsens when you are lying flat. Fatigue. Fever. Thick drainage from your nose, which often is green and may contain pus (purulent). Swelling and warmth over the affected sinuses. DIAGNOSIS  Your caregiver will perform a physical exam. During the exam, your caregiver may: Look in your nose for signs of abnormal growths in your nostrils (nasal polyps). Tap over the affected sinus to check for signs of infection. View the inside of your sinuses (endoscopy) with a special imaging device with a light attached (endoscope), which is inserted into your sinuses. If your caregiver suspects that you have chronic sinusitis, one or more of the following tests may be recommended: Allergy tests. Nasal culture A sample of mucus is taken from your nose and sent to a lab and screened for bacteria. Nasal cytology A sample of mucus is taken from your nose and examined by your caregiver to determine if your sinusitis is related to an allergy. TREATMENT  Most cases of acute sinusitis are related to a viral infection and will resolve on their own within 10 days. Sometimes medicines are prescribed to help relieve symptoms (pain medicine, decongestants, nasal steroid sprays, or saline sprays).  However, for sinusitis related to a bacterial infection, your caregiver will prescribe antibiotic medicines. These are medicines that will help kill the bacteria causing the infection.  Rarely, sinusitis is caused by a fungal infection. In theses cases, your caregiver will prescribe antifungal medicine. For some cases of chronic sinusitis, surgery is needed. Generally, these are cases in which sinusitis recurs more than 3 times per year, despite other treatments. HOME CARE INSTRUCTIONS  Drink plenty of water. Water helps thin the mucus so your sinuses can drain more easily. Use a humidifier. Inhale steam 3 to 4 times a day (for example, sit in the bathroom with the  shower running). Apply a warm, moist washcloth to your face 3 to 4 times a day, or as directed by your caregiver. Use saline nasal sprays to help moisten and clean your sinuses. Take over-the-counter or prescription medicines for pain, discomfort, or fever only as directed by your caregiver. SEEK IMMEDIATE MEDICAL CARE IF: You have increasing pain or severe headaches. You have nausea, vomiting, or drowsiness. You have swelling around your face. You have vision problems. You have a stiff neck. You have difficulty breathing. MAKE SURE YOU:  Understand these instructions. Will watch your condition. Will get help right away if you are not doing well or get worse. Document Released: 01/15/2005 Document Revised: 04/09/2011 Document Reviewed: 01/30/2011 [facility] Patient Information 2014 [place], [place].    If you have been instructed to have an in-person evaluation today at a local Urgent Care facility, please use the link below. It will take you to a list of all of our available [place] [facility], including address, phone number and hours of operation. Please do not delay care.  [place] [facility]  If you or a family member do not have a primary care provider, use the link below to schedule a visit and establish care. When you choose a  primary care physician or advanced  practice provider, you gain a long-term partner in health. Find a Primary Care Provider  Learn more about [facility] in-office and virtual care options: [place] - Get Care Now

## 2023-12-24 ENCOUNTER — Encounter: Payer: Self-pay | Admitting: Medical-Surgical

## 2023-12-24 DIAGNOSIS — B9689 Other specified bacterial agents as the cause of diseases classified elsewhere: Secondary | ICD-10-CM

## 2023-12-24 MED ORDER — AMOXICILLIN-POT CLAVULANATE 875-125 MG PO TABS: 1.0000 | ORAL_TABLET | Freq: Two times a day (BID) | ORAL | 0 refills | Status: DC

## 2024-01-04 ENCOUNTER — Telehealth: Admitting: Nurse Practitioner

## 2024-01-04 DIAGNOSIS — J069 Acute upper respiratory infection, unspecified: Secondary | ICD-10-CM

## 2024-01-04 MED ORDER — BENZONATATE 200 MG PO CAPS: 200.0000 mg | ORAL_CAPSULE | Freq: Two times a day (BID) | ORAL | 0 refills | Status: DC | PRN

## 2024-01-04 MED ORDER — PROMETHAZINE-DM 6.25-15 MG/5ML PO SYRP: 5.0000 mL | ORAL_SOLUTION | Freq: Four times a day (QID) | ORAL | 0 refills | Status: AC | PRN

## 2024-01-04 MED ORDER — LIDOCAINE VISCOUS HCL 2 % MT SOLN: 15.0000 mL | OROMUCOSAL | 0 refills | Status: AC | PRN

## 2024-01-04 NOTE — Progress Notes (Signed)
 We are sorry that you are not feeling well.  Here is how we plan to help!  Based on your presentation I believe you most likely have viral bronchitis. I would not recommend additional prednisone  or antibiotics at this time. I am sending a prescription cough syrup and viscous lidocaine  for your sore throat.     From your responses in the eVisit questionnaire you describe inflammation in the upper respiratory tract which is causing a significant cough.  This is commonly called Bronchitis and has four common causes:   Allergies Viral Infections Acid Reflux Bacterial Infection Allergies, viruses and acid reflux are treated by controlling symptoms or eliminating the cause. An example might be a cough caused by taking certain blood pressure medications. You stop the cough by changing the medication. Another example might be a cough caused by acid reflux. Controlling the reflux helps control the cough.  USE OF BRONCHODILATOR (RESCUE) INHALERS: There is a risk from using your bronchodilator too frequently.  The risk is that over-reliance on a medication which only relaxes the muscles surrounding the breathing tubes can reduce the effectiveness of medications prescribed to reduce swelling and congestion of the tubes themselves.  Although you feel brief relief from the bronchodilator inhaler, your asthma may actually be worsening with the tubes becoming more swollen and filled with mucus.  This can delay other crucial treatments, such as oral steroid medications. If you need to use a bronchodilator inhaler daily, several times per day, you should discuss this with your provider.  There are probably better treatments that could be used to keep your asthma under control.     HOME CARE Only take medications as instructed by your medical team. Complete the entire course of an antibiotic. Drink plenty of fluids and get plenty of rest. Avoid close contacts especially the very young and the elderly Cover your  mouth if you cough or cough into your sleeve. Always remember to wash your hands A steam or ultrasonic humidifier can help congestion.   GET HELP RIGHT AWAY IF: You develop worsening fever. You become short of breath You cough up blood. Your symptoms persist after you have completed your treatment plan MAKE SURE YOU  Understand these instructions. Will watch your condition. Will get help right away if you are not doing well or get worse.  Your e-visit answers were reviewed by a board certified advanced clinical practitioner to complete your personal care plan.  Depending on the condition, your plan could have included both over the counter or prescription medications. If there is a problem please reply  once you have received a response from your provider. Your safety is important to us .  If you have drug allergies check your prescription carefully.    You can use MyChart to ask questions about today's visit, request a non-urgent call back, or ask for a work or school excuse for 24 hours related to this e-Visit. If it has been greater than 24 hours you will need to follow up with your provider, or enter a new e-Visit to address those concerns. You will get an e-mail in the next two days asking about your experience.  I hope that your e-visit has been valuable and will speed your recovery. Thank you for using e-visits.   I have spent 5 minutes in review of e-visit questionnaire, review and updating patient chart, medical decision making and response to patient.   Codey Burling W Shantanu Strauch, NP

## 2024-01-08 ENCOUNTER — Ambulatory Visit

## 2024-01-08 ENCOUNTER — Ambulatory Visit: Admitting: Medical-Surgical

## 2024-01-08 ENCOUNTER — Encounter: Payer: Self-pay | Admitting: Medical-Surgical

## 2024-01-08 VITALS — BP 112/78 | HR 84 | Temp 98.7°F | Resp 20 | Ht 71.0 in | Wt 262.1 lb

## 2024-01-08 DIAGNOSIS — J019 Acute sinusitis, unspecified: Secondary | ICD-10-CM

## 2024-01-08 DIAGNOSIS — B9689 Other specified bacterial agents as the cause of diseases classified elsewhere: Secondary | ICD-10-CM | POA: Diagnosis not present

## 2024-01-08 DIAGNOSIS — R058 Other specified cough: Secondary | ICD-10-CM | POA: Diagnosis not present

## 2024-01-08 DIAGNOSIS — J069 Acute upper respiratory infection, unspecified: Secondary | ICD-10-CM

## 2024-01-08 DIAGNOSIS — R0602 Shortness of breath: Secondary | ICD-10-CM | POA: Diagnosis not present

## 2024-01-08 MED ORDER — BENZONATATE 200 MG PO CAPS: 200.0000 mg | ORAL_CAPSULE | Freq: Two times a day (BID) | ORAL | 0 refills | Status: AC | PRN

## 2024-01-08 MED ORDER — AIRSUPRA 90-80 MCG/ACT IN AERO: 2.0000 | INHALATION_SPRAY | Freq: Four times a day (QID) | RESPIRATORY_TRACT | 11 refills | Status: AC | PRN

## 2024-01-08 MED ORDER — LEVOFLOXACIN 750 MG PO TABS: 750.0000 mg | ORAL_TABLET | Freq: Every day | ORAL | 0 refills | Status: AC

## 2024-01-08 MED ORDER — HYDROCODONE BIT-HOMATROP MBR 5-1.5 MG/5ML PO SOLN: 5.0000 mL | Freq: Three times a day (TID) | ORAL | 0 refills | Status: AC | PRN

## 2024-01-08 NOTE — Progress Notes (Addendum)
 Acute Office Visit  Subjective:     Patient ID: Bryan Jackson, male    DOB: 1985/06/28, 38 y.o.   MRN: [id number]  No chief complaint on file.   HPI Patient is in today for persistent cough since September now with greenish/brown mucus production since yesterday. Also reports occasional headaches and sinus pressure. Difficulty with getting full breath in and states that his left lung feels tight. He has a history of enlarged turbinates and was scheduled to have turbinate reduction surgery this past January but was unable to have surgery done. He is looking to reschedule surgery for the spring. Just recently completed Augmentin  rx 11/28 and prednisone  taper. Sxs resolved, but then returned. Continues to use Flonase  twice daily, tessalon  capsules, promethazine -dm cough syrup, and albuterol  inhaler three times daily. Has tried Delsym otc with no relief. Denies fever or chills.  Review of Systems  Constitutional: Negative.   HENT:  Positive for congestion, sinus pain and sore throat.   Eyes: Negative.   Respiratory:  Positive for cough and sputum production.   Cardiovascular: Negative.   Gastrointestinal: Negative.   Genitourinary: Negative.   Musculoskeletal: Negative.   Skin: Negative.   Neurological: Negative.   Endo/Heme/Allergies: Negative.   Psychiatric/Behavioral: Negative.         Objective:    There were no vitals taken for this visit. BP Readings from Last 3 Encounters:  03/05/23 124/76  01/29/23 (!) 153/87  01/02/23 130/77      Physical Exam Vitals and nursing note reviewed.  Constitutional:      General: He is not in acute distress.    Appearance: Normal appearance.  HENT:     Nose: Congestion present.  Cardiovascular:     Rate and Rhythm: Normal rate and regular rhythm.     Pulses: Normal pulses.     Heart sounds: Normal heart sounds.  Pulmonary:     Effort: Pulmonary effort is normal.     Breath sounds: Decreased breath sounds present.   Neurological:     General: No focal deficit present.     Mental Status: He is alert and oriented to person, place, and time.  Psychiatric:        Mood and Affect: Mood normal.        Behavior: Behavior normal.        Thought Content: Thought content normal.        Judgment: Judgment normal.     No results found for any visits on 01/08/24.      Assessment & Plan:  1. Acute bacterial sinusitis (Primary) -Rx for levaquin sent to the pharmacy  -Rx for Hycodan cough syrup -Sample and Rx for AirSupra  - recommended conservative treatment such as warm tea with honey and vicks vaporub. -Chest x-ray ordered to rule out pneumonia - DG Chest 2 View; Future - benzonatate  (TESSALON ) 200 MG capsule; Take 1 capsule (200 mg total) by mouth 2 (two) times daily as needed for cough.  Dispense: 20 capsule; Refill: 0    Meds ordered this encounter  Medications   HYDROcodone bit-homatropine (HYCODAN) 5-1.5 MG/5ML syrup    Sig: Take 5 mLs by mouth every 8 (eight) hours as needed for cough.    Dispense:  120 mL    Refill:  0    Supervising Provider:   MATTHEWS, CODY [[id number]]   Albuterol -Budesonide (AIRSUPRA) 90-80 MCG/ACT AERO    Sig: Inhale 2 puffs into the lungs every 6 (six) hours as needed.    Dispense:  1 g    Refill:  11    Please dispense #1 inhaler    Supervising Provider:   MATTHEWS, CODY [[id number]]   levofloxacin (LEVAQUIN) 750 MG tablet    Sig: Take 1 tablet (750 mg total) by mouth daily.    Dispense:  7 tablet    Refill:  0    Supervising Provider:   MATTHEWS, CODY [[id number]]   benzonatate  (TESSALON ) 200 MG capsule    Sig: Take 1 capsule (200 mg total) by mouth 2 (two) times daily as needed for cough.    Dispense:  20 capsule    Refill:  0    Supervising Provider:   ALVIA, CODY [[id number]]    Return if symptoms worsen or fail to improve.  Derrek JINNY Freund, NP Student

## 2024-01-08 NOTE — Progress Notes (Signed)
 Medical screening examination/treatment was performed by qualified clinical staff member and as supervising provider I was immediately available for consultation/collaboration. I have reviewed documentation and agree with assessment and plan.  Thayer Ohm, DNP, APRN, FNP-BC [facility]

## 2024-01-10 ENCOUNTER — Encounter: Payer: Self-pay | Admitting: Medical-Surgical

## 2024-01-14 ENCOUNTER — Ambulatory Visit: Payer: Self-pay | Admitting: Medical-Surgical

## 2024-01-15 MED ORDER — OFLOXACIN 0.3 % OP SOLN: 1.0000 [drp] | Freq: Four times a day (QID) | OPHTHALMIC | 0 refills | Status: AC | PRN
